# Patient Record
Sex: Male | Born: 1965 | Race: White | Hispanic: No | Marital: Single | Smoking: Current some day smoker
Health system: Southern US, Community
[De-identification: ages and names within clinical notes are randomized; demographics above are authoritative.]

## PROBLEM LIST (undated history)

## (undated) DIAGNOSIS — T148XXA Other injury of unspecified body region, initial encounter: Secondary | ICD-10-CM

## (undated) DIAGNOSIS — F209 Schizophrenia, unspecified: Secondary | ICD-10-CM

---

## 2017-04-15 ENCOUNTER — Emergency Department: Payer: Medicare Other

## 2017-04-15 ENCOUNTER — Other Ambulatory Visit: Payer: Self-pay

## 2017-04-15 ENCOUNTER — Emergency Department
Admission: EM | Admit: 2017-04-15 | Discharge: 2017-04-15 | Disposition: A | Discharge status: Home / Self Care | Payer: Medicare Other | Attending: Emergency Medicine | Admitting: Emergency Medicine

## 2017-04-15 DIAGNOSIS — Y939 Activity, unspecified: Secondary | ICD-10-CM | POA: Diagnosis not present

## 2017-04-15 DIAGNOSIS — S2241XA Multiple fractures of ribs, right side, initial encounter for closed fracture: Secondary | ICD-10-CM | POA: Insufficient documentation

## 2017-04-15 DIAGNOSIS — Z79899 Other long term (current) drug therapy: Secondary | ICD-10-CM | POA: Diagnosis not present

## 2017-04-15 DIAGNOSIS — F17210 Nicotine dependence, cigarettes, uncomplicated: Secondary | ICD-10-CM | POA: Insufficient documentation

## 2017-04-15 DIAGNOSIS — W010XXA Fall on same level from slipping, tripping and stumbling without subsequent striking against object, initial encounter: Secondary | ICD-10-CM | POA: Insufficient documentation

## 2017-04-15 DIAGNOSIS — Y999 Unspecified external cause status: Secondary | ICD-10-CM | POA: Insufficient documentation

## 2017-04-15 DIAGNOSIS — Y929 Unspecified place or not applicable: Secondary | ICD-10-CM | POA: Diagnosis not present

## 2017-04-15 DIAGNOSIS — S299XXA Unspecified injury of thorax, initial encounter: Secondary | ICD-10-CM | POA: Diagnosis present

## 2017-04-15 MED ORDER — NAPROXEN 500 MG PO TABS: 500.0000 mg | ORAL_TABLET | Freq: Two times a day (BID) | ORAL | 2 refills | Status: DC

## 2017-04-15 MED ORDER — KETOROLAC TROMETHAMINE 30 MG/ML IJ SOLN
30.0000 mg | Freq: Once | INTRAMUSCULAR | Status: AC
  Administered 2017-04-15: 30 mg via INTRAMUSCULAR

## 2017-04-15 MED ORDER — TRAMADOL HCL 50 MG PO TABS: 50.0000 mg | ORAL_TABLET | Freq: Four times a day (QID) | ORAL | 0 refills | Status: DC | PRN

## 2017-04-15 MED ORDER — KETOROLAC TROMETHAMINE 30 MG/ML IJ SOLN
INTRAMUSCULAR | Status: AC
  Filled 2017-04-15: qty 1

## 2017-04-15 NOTE — ED Notes (Signed)
Patient transported to X-ray 

## 2017-04-15 NOTE — ED Notes (Signed)
Patient taken to imaging. 

## 2017-04-15 NOTE — ED Triage Notes (Addendum)
Pt reports fell 2 days ago, now having some right sided rib pain. (reports ran into a trash can and fell to the ground) Pt reports bruising to area.

## 2017-04-15 NOTE — ED Provider Notes (Signed)
[facility] Emergency Department Provider Note   ____________________________________________    I have reviewed the triage vital signs and the nursing notes.   HISTORY  Chief Complaint rib pain     HPI Bobby Massey is a 52 y.o. male presents with complaints of right rib pain.  Patient reports 2 days ago he fell striking his right lower ribs on a concrete curb.  Since then he has had significant moderate to severe throbbing pain in his right chest.  It is worse if he tries to take a deep breath.  Denies shortness of breath.  No fevers or chills.  No cough.  No hemoptysis.  Denies assault   History reviewed. No pertinent past medical history.  There are no active problems to display for this patient.   History reviewed. No pertinent surgical history.  Prior to Admission medications   Medication Sig Start Date End Date Taking? Authorizing Provider  naproxen (NAPROSYN) 500 MG tablet Take 1 tablet (500 mg total) by mouth 2 (two) times daily with a meal. 04/15/17   Jene EveryKinner, Chester Romero, MD  traMADol (ULTRAM) 50 MG tablet Take 1 tablet (50 mg total) by mouth every 6 (six) hours as needed. 04/15/17 04/15/18  Jene EveryKinner, Demetrius Mahler, MD     Allergies Patient has no known allergies.  No family history on file.  Social History Social History   Tobacco Use  . Smoking status: Current Every Day Smoker  . Smokeless tobacco: Never Used  Substance Use Topics  . Alcohol use: No    Frequency: Never  . Drug use: No    Review of Systems  Constitutional: No fever/chills Eyes: No visual changes.  ENT: No neck pain Cardiovascular: As above Respiratory: As above Gastrointestinal: No abdominal pain.  No nausea, no vomiting.   Genitourinary: Negative for dysuria. Musculoskeletal: Negative for back pain. Skin: Negative for rash or bruising Neurological: Negative for headaches   ____________________________________________   PHYSICAL EXAM:  VITAL SIGNS: ED Triage  Vitals [04/15/17 1902]  Enc Vitals Group     BP (!) 148/79     Pulse Rate (!) 108     Resp 18     Temp 98.2 F (36.8 C)     Temp Source Oral     SpO2 99 %     Weight 84.8 kg (187 lb)     Height 1.829 m (6')     Head Circumference      Peak Flow      Pain Score      Pain Loc      Pain Edu?      Excl. in GC?     Constitutional: Alert and oriented. No acute distress. Eyes: Conjunctivae are normal.  Head: Atraumatic.    Neck:  Painless ROM Cardiovascular: Normal rate, regular rhythm. Grossly normal heart sounds.  Good peripheral circulation.  Right inferior chest wall significant tenderness palpation, no bony abnormalities, no bruising crepitus Respiratory: Normal respiratory effort.  No retractions. Lungs CTAB. Gastrointestinal: Soft and nontender. No distention.  No CVA tenderness. Genitourinary: deferred Musculoskeletal:   Warm and well perfused Neurologic:  Normal speech and language. No gross focal neurologic deficits are appreciated.  Skin:  Skin is warm, dry and intact. No rash noted. Psychiatric: Mood and affect are normal. Speech and behavior are normal.  ____________________________________________   LABS (all labs ordered are listed, but only abnormal results are displayed)  Labs Reviewed - No data to display ____________________________________________  EKG   ____________________________________________  RADIOLOGY  Rib x-rays ____________________________________________   PROCEDURES  Procedure(s) performed: No  Procedures   Critical Care performed: No ____________________________________________   INITIAL IMPRESSION / ASSESSMENT AND PLAN / ED COURSE  Pertinent labs & imaging results that were available during my care of the patient were reviewed by me and considered in my medical decision making (see chart for details).  Patient presents after trauma to the right lower ribs, dedicated rib films pending ,IM tramadol  X-rays demonstrate  acute anterior right eighth and ninth rib fractures, no pneumothorax  Patient had significant improvement with Toradol.  Will discharge with analgesics, I-S    ____________________________________________   FINAL CLINICAL IMPRESSION(S) / ED DIAGNOSES  Final diagnoses:  Closed fracture of multiple ribs of right side, initial encounter        Note:  This document was prepared using Dragon voice recognition software and may include unintentional dictation errors.    Jene Every, MD 04/15/17 2136

## 2017-04-15 NOTE — ED Triage Notes (Signed)
First Nurse Note:  Arrives via [facility] -- patient [place] up [place] police station.  C/O right rib pain.  EMS reports bruising to area.  Pateitn states he fell onto concrete two days ago.   AAOx3.  Skin warm and dry. No SOB/ DOE.  NAD.  Requests a dinner tray during ED stay.

## 2017-04-30 ENCOUNTER — Emergency Department
Admission: EM | Admit: 2017-04-30 | Discharge: 2017-04-30 | Disposition: A | Discharge status: Home / Self Care | Payer: Medicare Other | Attending: Emergency Medicine | Admitting: Emergency Medicine

## 2017-04-30 ENCOUNTER — Other Ambulatory Visit: Payer: Self-pay

## 2017-04-30 ENCOUNTER — Emergency Department: Payer: Medicare Other

## 2017-04-30 DIAGNOSIS — W19XXXD Unspecified fall, subsequent encounter: Secondary | ICD-10-CM | POA: Diagnosis not present

## 2017-04-30 DIAGNOSIS — S2241XD Multiple fractures of ribs, right side, subsequent encounter for fracture with routine healing: Secondary | ICD-10-CM | POA: Insufficient documentation

## 2017-04-30 DIAGNOSIS — F17200 Nicotine dependence, unspecified, uncomplicated: Secondary | ICD-10-CM | POA: Insufficient documentation

## 2017-04-30 DIAGNOSIS — S2241XA Multiple fractures of ribs, right side, initial encounter for closed fracture: Secondary | ICD-10-CM

## 2017-04-30 MED ORDER — KETOROLAC TROMETHAMINE 30 MG/ML IJ SOLN
30.0000 mg | Freq: Once | INTRAMUSCULAR | Status: AC
  Administered 2017-04-30: 30 mg via INTRAMUSCULAR
  Filled 2017-04-30: qty 1

## 2017-04-30 MED ORDER — IBUPROFEN 800 MG PO TABS: 800.0000 mg | ORAL_TABLET | Freq: Three times a day (TID) | ORAL | 0 refills | Status: DC | PRN

## 2017-04-30 NOTE — ED Notes (Signed)
See triage note states he fell about 1 week ago  Was dx;dwith rib fx  states pain is getting worse on the right  Denies any new injury  Has been taking tramadol w/o much relief

## 2017-04-30 NOTE — ED Provider Notes (Signed)
[facility] Emergency Department Provider Note  ____________________________________________  Time seen: Approximately 6:30 PM  I have reviewed the triage vital signs and the nursing notes.   HISTORY  Chief Complaint Back Pain and Pleurisy    HPI Bobby Massey is a 52 y.o. male that presents to the emergency department for evaluation of right lateral rib pain for 2 weeks.  Patient was seen in the emergency department after fall 2 weeks ago and was told that he has 2 rib fractures.  Pain is worsening.  He has not taken any medication for pain because his Medicare did not pay for the tramadol.  No new injury.  He has schizophrenia and would like to talk to psychiatry. He is not having any auditory or visual hallucination.  No suicidal or homicidal ideations. No shortness of breath, chest pain, nausea, vomiting, abdominal pain.  History reviewed. No pertinent past medical history.  There are no active problems to display for this patient.   History reviewed. No pertinent surgical history.  Prior to Admission medications   Medication Sig Start Date End Date Taking? Authorizing Provider  ibuprofen (ADVIL,MOTRIN) 800 MG tablet Take 1 tablet (800 mg total) by mouth every 8 (eight) hours as needed. 04/30/17   Enid Derry, PA-C  naproxen (NAPROSYN) 500 MG tablet Take 1 tablet (500 mg total) by mouth 2 (two) times daily with a meal. 04/15/17   Jene Every, MD  traMADol (ULTRAM) 50 MG tablet Take 1 tablet (50 mg total) by mouth every 6 (six) hours as needed. 04/15/17 04/15/18  Jene Every, MD    Allergies Patient has no known allergies.  No family history on file.  Social History Social History   Tobacco Use  . Smoking status: Current Every Day Smoker  . Smokeless tobacco: Never Used  Substance Use Topics  . Alcohol use: No    Frequency: Never  . Drug use: No     Review of Systems  Cardiovascular: No chest pain. Respiratory:  No  SOB. Gastrointestinal: No abdominal pain.  No nausea, no vomiting.  Musculoskeletal: Positive for rib pain. Skin: Negative for rash, abrasions, lacerations, ecchymosis. Neurological: Negative for headaches, numbness or tingling   ____________________________________________   PHYSICAL EXAM:  VITAL SIGNS: ED Triage Vitals [04/30/17 1722]  Enc Vitals Group     BP      Pulse      Resp      Temp      Temp src      SpO2      Weight 185 lb (83.9 kg)     Height 6' (1.829 m)     Head Circumference      Peak Flow      Pain Score 8     Pain Loc      Pain Edu?      Excl. in GC?      Constitutional: Alert and oriented. Well appearing and in no acute distress. Eyes: Conjunctivae are normal. PERRL. EOMI. Head: Atraumatic. ENT:      Ears:      Nose: No congestion/rhinnorhea.      Mouth/Throat: Mucous membranes are moist.  Neck: No stridor.  Cardiovascular: Normal rate, regular rhythm.  Good peripheral circulation. Respiratory: Normal respiratory effort without tachypnea or retractions. Lungs CTAB. Good air entry to the bases with no decreased or absent breath sounds.  Gastrointestinal: No abdominal tenderness to palpation. No guarding or rigidity. No distention. Musculoskeletal: Full range of motion to all extremities. No gross deformities appreciated.  Hypersensitive to palpation over right lateral inferior rib cage. Neurologic:  Normal speech and language. No gross focal neurologic deficits are appreciated.  Skin:  Skin is warm, dry and intact. No rash noted.   ____________________________________________   LABS (all labs ordered are listed, but only abnormal results are displayed)  Labs Reviewed - No data to display ____________________________________________  EKG   ____________________________________________  RADIOLOGY Lexine BatonI, Weylin Plagge, personally viewed and evaluated these images (plain radiographs) as part of my medical decision making, as well as reviewing the  written report by the radiologist.  Dg Ribs Unilateral W/chest Right  Result Date: 04/30/2017 CLINICAL DATA:  Right-sided rib pain. Recent fall with right eighth and ninth rib fractures. EXAM: RIGHT RIBS AND CHEST - 3+ VIEW COMPARISON:  04/15/2017 FINDINGS: Similar finding nondisplaced fractures involving the anterior aspects of the right eighth and ninth ribs that demonstrate no significant evidence of healing since the prior x-rays. Stable old, healed fractures of the right fourth through seventh ribs. Stable probable underlying chronic lung disease. There is no evidence of pulmonary edema, consolidation, pneumothorax, nodule or pleural fluid. IMPRESSION: Stable right distal eighth and ninth rib fractures demonstrating no displacement or significant evidence of interval healing. Electronically Signed   By: Irish LackGlenn  Yamagata M.D.   On: 04/30/2017 19:00    ____________________________________________    PROCEDURES  Procedure(s) performed:    Procedures    Medications  ketorolac (TORADOL) 30 MG/ML injection 30 mg (30 mg Intramuscular Given 04/30/17 1856)     ____________________________________________   INITIAL IMPRESSION / ASSESSMENT AND PLAN / ED COURSE  Pertinent labs & imaging results that were available during my care of the patient were reviewed by me and considered in my medical decision making (see chart for details).  Review of the [place] CSRS was performed in accordance of the [facility] prior to dispensing any controlled drugs.   Patient presents to the emergency department for evaluation of right lateral rib pain after fall 2 weeks ago.  Repeat chest x-ray consistent with previously noted rib fractures.  Then patient stated that he was really here to talk to psychiatry about being admitted because he is homeless and it is too cold outside. TTS evaluated patient. Patient does not have any new injury so there is no indication for narcotic pain medication.  Patient will be discharged  home with prescriptions for ibuprofen. Patient is to follow up with PCP as directed. Patient is given ED precautions to return to the ED for any worsening or new symptoms.     ____________________________________________  FINAL CLINICAL IMPRESSION(S) / ED DIAGNOSES  Final diagnoses:  Closed fracture of multiple ribs of right side, initial encounter      NEW MEDICATIONS STARTED DURING THIS VISIT:  ED Discharge Orders        Ordered    ibuprofen (ADVIL,MOTRIN) 800 MG tablet  Every 8 hours PRN     04/30/17 2106          This chart was dictated using voice recognition software/Dragon. Despite best efforts to proofread, errors can occur which can change the meaning. Any change was purely unintentional.    Enid DerryWagner, Shaine Mount, PA-C 04/30/17 [id number]    Dionne BucySiadecki, Sebastian, MD 04/30/17 2348

## 2017-04-30 NOTE — ED Notes (Signed)
TTS spoke with Mr. Bobby Massey. He denied symptoms of Depression or Anxiety.  He denied having auditory or visual hallucinations.  He denied suicidal or homicidal ideation or intent.  He states that he is homeless and that it is too cold for him to be outside.  He states that he did not want to go to the shelter because he "did not get along with the staff".  He reports that he has financial problems and has not received his check in some time.  He states that he has a history of paranoid schizophrenia, and has been off his medication for about 2 months.  He denied wanting to go to [facility] stating "I don't need them people".  He reports that he is now receiving services as [facility] on [address]  He reports that the has a Veterinary surgeoncounselor who has initiated services for him to see a psychiatrist and housing placement.  He states that he does not want the TTS to contact Social Services to identify areas that they could assist him with.  When asked how TTS could help him, He stated again, "It is too cold out there, I need to be in the hospital".  He was informed that TTS could not place him in the hospital this evening. Mr. Bobby Massey became loud and yelled at TTS that he investigated before he came to the hospital this evening and that he was going to be admitted because it is cold outside.

## 2017-04-30 NOTE — ED Triage Notes (Signed)
Pt comes into the ED via EMS from [facility] with c/o right lateral rib pain and back pain from a fall 2 weeks ago.Marland Kitchen. Pt is ambulatory to triage without any difficulty.

## 2017-05-16 ENCOUNTER — Emergency Department
Admission: EM | Admit: 2017-05-16 | Discharge: 2017-05-17 | Disposition: A | Discharge status: Home / Self Care | Payer: Medicare Other | Attending: Emergency Medicine | Admitting: Emergency Medicine

## 2017-05-16 ENCOUNTER — Other Ambulatory Visit: Payer: Self-pay

## 2017-05-16 ENCOUNTER — Encounter: Payer: Self-pay | Admitting: Emergency Medicine

## 2017-05-16 DIAGNOSIS — F200 Paranoid schizophrenia: Secondary | ICD-10-CM

## 2017-05-16 DIAGNOSIS — F17210 Nicotine dependence, cigarettes, uncomplicated: Secondary | ICD-10-CM | POA: Diagnosis not present

## 2017-05-16 DIAGNOSIS — F209 Schizophrenia, unspecified: Secondary | ICD-10-CM

## 2017-05-16 DIAGNOSIS — Z046 Encounter for general psychiatric examination, requested by authority: Secondary | ICD-10-CM | POA: Diagnosis present

## 2017-05-16 HISTORY — DX: Schizophrenia, unspecified: F20.9

## 2017-05-16 LAB — COMPREHENSIVE METABOLIC PANEL
ALT: 18 U/L (ref 17–63)
AST: 29 U/L (ref 15–41)
Albumin: 4.3 g/dL (ref 3.5–5.0)
Alkaline Phosphatase: 73 U/L (ref 38–126)
Anion gap: 11 (ref 5–15)
BUN: 11 mg/dL (ref 6–20)
CHLORIDE: 101 mmol/L (ref 101–111)
CO2: 23 mmol/L (ref 22–32)
CREATININE: 0.96 mg/dL (ref 0.61–1.24)
Calcium: 9.4 mg/dL (ref 8.9–10.3)
GFR calc Af Amer: >60 mL/min (ref >60)
GFR calc non Af Amer: >60 mL/min (ref >60)
Glucose, Bld: 162 mg/dL — ABNORMAL HIGH (ref 65–99)
Potassium: 3.7 mmol/L (ref 3.5–5.1)
SODIUM: 135 mmol/L (ref 135–145)
Total Bilirubin: 0.9 mg/dL (ref 0.3–1.2)
Total Protein: 7.5 g/dL (ref 6.5–8.1)

## 2017-05-16 LAB — CBC
HCT: 41.7 % (ref 40.0–52.0)
HEMOGLOBIN: 14.1 g/dL (ref 13.0–18.0)
MCH: 31 pg (ref 26.0–34.0)
MCHC: 33.9 g/dL (ref 32.0–36.0)
MCV: 91.4 fL (ref 80.0–100.0)
Platelets: 200 10*3/uL (ref 150–440)
RBC: 4.56 MIL/uL (ref 4.40–5.90)
RDW: 13.1 % (ref 11.5–14.5)
WBC: 8.3 10*3/uL (ref 3.8–10.6)

## 2017-05-16 LAB — ETHANOL: Alcohol, Ethyl (B): <10 mg/dL (ref <10)

## 2017-05-16 LAB — SALICYLATE LEVEL: Salicylate Lvl: <7 mg/dL (ref 2.8–30.0)

## 2017-05-16 LAB — ACETAMINOPHEN LEVEL: Acetaminophen (Tylenol), Serum: <10 ug/mL — ABNORMAL LOW (ref 10–30)

## 2017-05-16 MED ORDER — PALIPERIDONE PALMITATE 234 MG/1.5ML IM SUSP
234.0000 mg | Freq: Once | INTRAMUSCULAR | Status: AC
  Administered 2017-05-16: 234 mg via INTRAMUSCULAR
  Filled 2017-05-16: qty 1.5

## 2017-05-16 NOTE — BH Assessment (Signed)
This Clinical research associatewriter received a return call from patients sister, Burnell BlanksMargie Arnold and she stated she lives in [place] and is unable to pick up patient from ED.

## 2017-05-16 NOTE — ED Notes (Signed)
Pt. Requested and was given soft cover small new testament bible.

## 2017-05-16 NOTE — Discharge Instructions (Signed)
please return for any further problems. Follow-up with your doctor on Friday as planned.

## 2017-05-16 NOTE — ED Notes (Signed)
Dressed out by this Therapist, artN and keith EMP

## 2017-05-16 NOTE — Consult Note (Signed)
[place] Psychiatry Consult   Reason for Consult: Consult for 52 year old man who came voluntarily to the emergency room seeking help for psychiatric symptoms Referring Physician: Rip Harbour Patient Identification: Bobby Massey MRN:  [id number] Principal Diagnosis: Schizophrenia [facility]) Diagnosis:   Patient Active Problem List   Diagnosis Date Noted  . Schizophrenia ([place]) [F20.9] 05/16/2017    Total Time spent with patient: 1 hour  Subjective:   Bobby Massey is a 52 y.o. male patient admitted with "I need some help with my episode".  HPI: Patient interviewed chart reviewed.  Patient came to the emergency room today stating he was having a "schizophrenic episode".  He states that for the past couple months he has been having auditory and visual hallucinations.  When asked to describe them he is rather vague and cannot give much detail.  He says that he sees "people's faces" and that his hallucinations are "things that are not natural".  Patient says he has been sleeping poorly at night.  Has no clear place to stay right now.  Mood feels down and nervous.  He denies any suicidal or homicidal thoughts.  He denies that he has been drinking or using any drugs.  He says that he used to receive regular medication for schizophrenia but has not had any in a couple months.  He last remembers [place] being helpful for him.  Social history: Patient says he has been living in [facility] for several months.  He stayed at the shelter for a while but cannot go back for now.  He says he is living outside in the woods currently.  Medical history: Does not report any known medical problems  Substance abuse history: He says there are times when he drinks too much but has not been using any alcohol the last few months.  Denies any drug use.  Past Psychiatric History: Patient says he has had prior hospitalizations most recently in [place] at [facility].  He says he has been diagnosed with  schizophrenia.  Denies any history of suicide attempts or violence.  He remembers having been given an long-acting injection of Invega which he found very helpful.  Risk to Self: Suicidal Ideation: No Suicidal Intent: No Is patient at risk for suicide?: No Suicidal Plan?: No Access to Means: No What has been your use of drugs/alcohol within the last 12 months?: None reported  How many times?: 0 Other Self Harm Risks: None reported  Triggers for Past Attempts: None known Intentional Self Injurious Behavior: None Risk to Others: Homicidal Ideation: No Thoughts of Harm to Others: No Current Homicidal Intent: No Current Homicidal Plan: No Access to Homicidal Means: No Identified Victim: None reported  History of harm to others?: No Assessment of Violence: None Noted Violent Behavior Description: None reported  Does patient have access to weapons?: No Criminal Charges Pending?: No Does patient have a court date: No Prior Inpatient Therapy: Prior Inpatient Therapy: Yes Prior Therapy Facilty/Provider(s): "several" Reason for Treatment: psychosis Prior Outpatient Therapy: Prior Outpatient Therapy: No Prior Therapy Dates: None reported  Prior Therapy Facilty/Provider(s): None reported  Reason for Treatment: None reported  Does patient have an ACCT team?: No Does patient have Intensive In-House Services?  : No Does patient have [facility] services? : No Does patient have P4CC services?: No  Past Medical History:  Past Medical History:  Diagnosis Date  . Schizophrenia ([place])    History reviewed. No pertinent surgical history. Family History: History reviewed. No pertinent family history. Family Psychiatric  History:  Denies knowing of any Social History:  Social History   Substance and Sexual Activity  Alcohol Use No  . Frequency: Never     Social History   Substance and Sexual Activity  Drug Use No    Social History   Socioeconomic History  . Marital status: Single     Spouse name: None  . Number of children: None  . Years of education: None  . Highest education level: None  Social Needs  . Financial resource strain: None  . Food insecurity - worry: None  . Food insecurity - inability: None  . Transportation needs - medical: None  . Transportation needs - non-medical: None  Occupational History  . None  Tobacco Use  . Smoking status: Current Every Day Smoker  . Smokeless tobacco: Never Used  Substance and Sexual Activity  . Alcohol use: No    Frequency: Never  . Drug use: No  . Sexual activity: None  Other Topics Concern  . None  Social History Narrative  . None   Additional Social History:    Allergies:   Allergies  Allergen Reactions  . Haldol [Haloperidol]     "crazy"    Labs:  Results for orders placed or performed during the hospital encounter of 05/16/17 (from the past 48 hour(s))  Comprehensive metabolic panel     Status: Abnormal   Collection Time: 05/16/17 12:52 PM  Result Value Ref Range   Sodium 135 135 - 145 mmol/L   Potassium 3.7 3.5 - 5.1 mmol/L   Chloride 101 101 - 111 mmol/L   CO2 23 22 - 32 mmol/L   Glucose, Bld 162 (H) 65 - 99 mg/dL   BUN 11 6 - 20 mg/dL   Creatinine, Ser 0.96 0.61 - 1.24 mg/dL   Calcium 9.4 8.9 - 10.3 mg/dL   Total Protein 7.5 6.5 - 8.1 g/dL   Albumin 4.3 3.5 - 5.0 g/dL   AST 29 15 - 41 U/L   ALT 18 17 - 63 U/L   Alkaline Phosphatase 73 38 - 126 U/L   Total Bilirubin 0.9 0.3 - 1.2 mg/dL   GFR calc non Af Amer >60 >60 mL/min   GFR calc Af Amer >60 >60 mL/min    Comment: (NOTE) The eGFR has been calculated using the CKD EPI equation. This calculation has not been validated in all clinical situations. eGFR's persistently <60 mL/min signify possible Chronic Kidney Disease.    Anion gap 11 5 - 15    Comment: Performed at [facility], [address] [place], [place] [address]  Ethanol     Status: None   Collection Time: 05/16/17 12:52 PM  Result Value Ref Range   Alcohol,  Ethyl (B) <10 <10 mg/dL    Comment:        LOWEST DETECTABLE LIMIT FOR SERUM ALCOHOL IS 10 mg/dL FOR MEDICAL PURPOSES ONLY Performed at [facility], [address] [place], [place] [address]   Salicylate level     Status: None   Collection Time: 05/16/17 12:52 PM  Result Value Ref Range   Salicylate Lvl <8.2 2.8 - 30.0 mg/dL    Comment: Performed at [facility], [address] [place], [place] [address]  Acetaminophen level     Status: Abnormal   Collection Time: 05/16/17 12:52 PM  Result Value Ref Range   Acetaminophen (Tylenol), Serum <10 (L) 10 - 30 ug/mL    Comment:        THERAPEUTIC CONCENTRATIONS VARY SIGNIFICANTLY.  A RANGE OF 10-30 ug/mL MAY BE AN EFFECTIVE CONCENTRATION FOR MANY PATIENTS. HOWEVER, SOME ARE BEST TREATED AT CONCENTRATIONS OUTSIDE THIS RANGE. ACETAMINOPHEN CONCENTRATIONS >150 ug/mL AT 4 HOURS AFTER INGESTION AND >50 ug/mL AT 12 HOURS AFTER INGESTION ARE OFTEN ASSOCIATED WITH TOXIC REACTIONS. Performed at [facility], [address] [place], [place] [address]   cbc     Status: None   Collection Time: 05/16/17 12:52 PM  Result Value Ref Range   WBC 8.3 3.8 - 10.6 K/uL   RBC 4.56 4.40 - 5.90 MIL/uL   Hemoglobin 14.1 13.0 - 18.0 g/dL   HCT 41.7 40.0 - 52.0 %   MCV 91.4 80.0 - 100.0 fL   MCH 31.0 26.0 - 34.0 pg   MCHC 33.9 32.0 - 36.0 g/dL   RDW 13.1 11.5 - 14.5 %   Platelets 200 150 - 440 K/uL    Comment: Performed at [facility], [address] [place], [place] [address]    Current Facility-Administered Medications  Medication Dose Route Frequency Provider Last Rate Last Dose  . paliperidone (INVEGA SUSTENNA) injection 234 mg  234 mg Intramuscular Once Clapacs, Madie Reno, MD       Current Outpatient Medications  Medication Sig Dispense Refill  . ibuprofen (ADVIL,MOTRIN) 800 MG tablet Take 1 tablet (800 mg total) by mouth every 8 (eight) hours as needed. 30 tablet 0  . naproxen (NAPROSYN) 500 MG  tablet Take 1 tablet (500 mg total) by mouth 2 (two) times daily with a meal. 20 tablet 2  . traMADol (ULTRAM) 50 MG tablet Take 1 tablet (50 mg total) by mouth every 6 (six) hours as needed. 20 tablet 0    Musculoskeletal: Strength & Muscle Tone: within normal limits Gait & Station: normal Patient leans: Backward  Psychiatric Specialty Exam: Physical Exam  Nursing note and vitals reviewed. Constitutional: He appears well-developed and well-nourished.  HENT:  Head: Normocephalic and atraumatic.  Eyes: Conjunctivae are normal. Pupils are equal, round, and reactive to light.  Neck: Normal range of motion.  Cardiovascular: Regular rhythm and normal heart sounds.  Respiratory: Effort normal. No respiratory distress.  GI: Soft.  Musculoskeletal: Normal range of motion.  Neurological: He is alert.  Skin: Skin is warm and dry.  Psychiatric: Judgment normal. His affect is blunt. His speech is delayed. He is slowed. Thought content is not paranoid. Cognition and memory are normal. He expresses no homicidal and no suicidal ideation.    Review of Systems  Constitutional: Negative.   HENT: Negative.   Eyes: Negative.   Respiratory: Negative.   Cardiovascular: Negative.   Gastrointestinal: Negative.   Musculoskeletal: Negative.   Skin: Negative.   Neurological: Negative.   Psychiatric/Behavioral: Positive for depression and hallucinations. Negative for memory loss, substance abuse and suicidal ideas. The patient is nervous/anxious and has insomnia.     Blood pressure 118/66, pulse 96, temperature 97.7 F (36.5 C), temperature source Oral, resp. rate 20, height '5\' 11"'  (1.803 m), weight 83.9 kg (185 lb), SpO2 96 %.Body mass index is 25.8 kg/m.  General Appearance: Disheveled  Eye Contact:  Minimal  Speech:  Slow  Volume:  Decreased  Mood:  Depressed and Dysphoric  Affect:  Constricted  Thought Process:  Goal Directed  Orientation:  Full (Time, Place, and Person)  Thought Content:   Logical and Hallucinations: Auditory Visual  Suicidal Thoughts:  No  Homicidal Thoughts:  No  Memory:  Immediate;   Fair Recent;   Fair Remote;   Fair  Judgement:  Fair  Insight:  Fair  Psychomotor Activity:  Decreased  Concentration:  Concentration: Fair  Recall:  [employer] of Knowledge:  Fair  Language:  Fair  Akathisia:  No  Handed:  Right  AIMS (if indicated):     Assets:  Desire for Improvement Physical Health  ADL's:  Intact  Cognition:  WNL  Sleep:        Treatment Plan Summary: Medication management and Plan 52 year old man who claims to have a past history of schizophrenia.  We have no old records available that indicate psychiatric history.  He is very disheveled and appears to have been living outdoors.  He is lucid however and despite his complaints of auditory hallucinations he is able to engage in appropriate conversation.  Denies suicidal or homicidal ideation.  Patient does not meet commitment criteria.  We currently do not have bed space available in the hospital.  I offered him the opportunity to get a long-acting Invega injection today as he says he has a follow-up appointment to see a doctor on Friday.  Patient is agreeable to the plan.  Case reviewed with emergency room physician.  After getting his injection he can be discharged as he has local follow-up in place.  Disposition: No evidence of imminent risk to self or others at present.   Supportive therapy provided about ongoing stressors. Discussed crisis plan, support from social network, calling 911, coming to the Emergency Department, and calling Suicide Hotline.  Alethia Berthold, MD 05/16/2017 4:48 PM

## 2017-05-16 NOTE — BH Assessment (Signed)
Assessment Note  Bobby Massey is an 52 y.o. male. Patient presents to [facility] under IVC due to psychosis. Patient denied SI/HI, however endorsed AVH. Patient stated he is seeing "vulgar things" he'd rather not disclose. Patient endorsed hearing voices trying steal things from him. Patient endorsed a Paranoid Schizophrenia diagnosis and stated he is having an episode. Patient denied SI/HI, and any outpatient mental health providers. In addition, patient endorsed several psychiatric inpatient hospitalizations.   Diagnosis: Psychosis  Past Medical History:  Past Medical History:  Diagnosis Date  . Schizophrenia ([facility])     History reviewed. No pertinent surgical history.  Family History: History reviewed. No pertinent family history.  Social History:  reports that he has been smoking.  he has never used smokeless tobacco. He reports that he does not drink alcohol or use drugs.  Additional Social History:  Alcohol / Drug Use Pain Medications: SEE PTA  Prescriptions: SEE PTA  Over the Counter: SEE PTA History of alcohol / drug use?: No history of alcohol / drug abuse Longest period of sobriety (when/how long): Unknown  CIWA: CIWA-Ar BP: 118/66 Pulse Rate: 96 COWS:    Allergies:  Allergies  Allergen Reactions  . Haldol [Haloperidol]     "crazy"    Home Medications:  (Not in a hospital admission)  OB/GYN Status:  No LMP for male patient.  General Assessment Data Assessment unable to be completed: (Assessment completed ) Reason for not completing assessment: Assessment completed  [facility] Assessment: In system Is this a Tele or Face-to-Face Assessment?: Face-to-Face Is this an Initial Assessment or a Re-assessment for this encounter?: Initial Assessment Marital status: Single Maiden name: N/A Is patient pregnant?: No Pregnancy Status: No Living Arrangements: Other (Comment)(Homeless) Can pt return to current living arrangement?: Yes Admission  Status: Voluntary Is patient capable of signing voluntary admission?: Yes Referral Source: Self/Family/Friend Insurance type: Medicare  Medical Screening Exam [facility] Walk-in ONLY) Medical Exam completed: Yes  Crisis Care Plan Living Arrangements: Other (Comment)(Homeless) Legal Guardian: Other:(None reported ) Name of Psychiatrist: None reported  Name of Therapist: None reported   Education Status Is patient currently in school?: No Current Grade: N/A Highest grade of school patient has completed: 8th grade  Name of school: N/A Contact person: N/A  Risk to self with the past 6 months Suicidal Ideation: No Has patient been a risk to self within the past 6 months prior to admission? : No Suicidal Intent: No Has patient had any suicidal intent within the past 6 months prior to admission? : No Is patient at risk for suicide?: No Suicidal Plan?: No Has patient had any suicidal plan within the past 6 months prior to admission? : No Access to Means: No What has been your use of drugs/alcohol within the last 12 months?: None reported  Previous Attempts/Gestures: No How many times?: 0 Other Self Harm Risks: None reported  Triggers for Past Attempts: None known Intentional Self Injurious Behavior: None Family Suicide History: No Recent stressful life event(s): Other (Comment)(Homelessness) Persecutory voices/beliefs?: No Depression: Yes Depression Symptoms: Loss of interest in usual pleasures Substance abuse history and/or treatment for substance abuse?: No Suicide prevention information given to non-admitted patients: Not applicable  Risk to Others within the past 6 months Homicidal Ideation: No Does patient have any lifetime risk of violence toward others beyond the six months prior to admission? : No Thoughts of Harm to Others: No Current Homicidal Intent: No Current Homicidal Plan: No Access to Homicidal Means: No Identified Victim:  None reported  History of harm to  others?: No Assessment of Violence: None Noted Violent Behavior Description: None reported  Does patient have access to weapons?: No Criminal Charges Pending?: No Does patient have a court date: No Is patient on probation?: No  Psychosis Hallucinations: None noted Delusions: None noted  Mental Status Report Appearance/Hygiene: In hospital gown, Disheveled Eye Contact: Poor Motor Activity: Unremarkable Speech: Pressured Level of Consciousness: Alert Mood: Preoccupied Affect: Preoccupied Anxiety Level: None Thought Processes: Thought Blocking Judgement: Impaired Orientation: Person, Place, Time, Situation, Appropriate for developmental age Obsessive Compulsive Thoughts/Behaviors: None  Cognitive Functioning Concentration: Fair Memory: Remote Intact, Recent Intact IQ: Average Insight: Poor Impulse Control: Poor Appetite: Poor Weight Loss: 0 Weight Gain: 0 Sleep: Decreased Total Hours of Sleep: 2 Vegetative Symptoms: None  ADLScreening [facility] Assessment Services) Patient's cognitive ability adequate to safely complete daily activities?: Yes Patient able to express need for assistance with ADLs?: Yes Independently performs ADLs?: Yes (appropriate for developmental age)  Prior Inpatient Therapy Prior Inpatient Therapy: Yes Prior Therapy Facilty/Provider(s): "several" Reason for Treatment: psychosis  Prior Outpatient Therapy Prior Outpatient Therapy: No Prior Therapy Dates: None reported  Prior Therapy Facilty/Provider(s): None reported  Reason for Treatment: None reported  Does patient have an ACCT team?: No Does patient have Intensive In-House Services?  : No Does patient have [facility] services? : No Does patient have P4CC services?: No  ADL Screening (condition at time of admission) Patient's cognitive ability adequate to safely complete daily activities?: Yes Is the patient deaf or have difficulty hearing?: No Does the patient have difficulty seeing, even when  wearing glasses/contacts?: No Does the patient have difficulty concentrating, remembering, or making decisions?: No Patient able to express need for assistance with ADLs?: Yes Does the patient have difficulty dressing or bathing?: No Independently performs ADLs?: Yes (appropriate for developmental age) Does the patient have difficulty walking or climbing stairs?: No Weakness of Legs: None  Home Assistive Devices/Equipment Home Assistive Devices/Equipment: None  Therapy Consults (therapy consults require a physician order) PT Evaluation Needed: No OT Evalulation Needed: No SLP Evaluation Needed: No Abuse/Neglect Assessment (Assessment to be complete while patient is alone) Abuse/Neglect Assessment Can Be Completed: Yes Physical Abuse: Denies Verbal Abuse: Denies Sexual Abuse: Denies Exploitation of patient/patient's resources: Denies Self-Neglect: Denies Possible abuse reported to:: Other (Comment) Values / Beliefs Cultural Requests During Hospitalization: None Spiritual Requests During Hospitalization: None Consults Spiritual Care Consult Needed: No Social Work Consult Needed: No Merchant navy officer (For Healthcare) Does Patient Have a Medical Advance Directive?: No    Additional Information 1:1 In Past 12 Months?: No CIRT Risk: No Elopement Risk: No Does patient have medical clearance?: Yes     Disposition:  Disposition Initial Assessment Completed for this Encounter: Yes Disposition of Patient: Pending Review with psychiatrist  On Site Evaluation by:   Reviewed with Physician:    Tramaine Sauls L Britainy Kozub, LPCA, LCASA 05/16/2017 2:50 PM

## 2017-05-16 NOTE — ED Provider Notes (Signed)
patient seen by Dr. Toni Amendlapacs. Dr. Toni Amendlapacs feels the patient can get the long-acting injection that he asked for and follow-up with his doctor on Friday. He is not acutely suicidal or homicidal and appears to be able to manage himself adequately as an outpatient.   Arnaldo NatalMalinda, Lamona Eimer F, MD 05/16/17 [phone]

## 2017-05-16 NOTE — ED Notes (Signed)
Pt. Requested and was given meal tray and drink.  Pt. Made familiar with unit.  Pt. Aware he would be spending the night in unit tonight.  Pt. Has no complaints at this time.

## 2017-05-16 NOTE — ED Notes (Signed)
Pt. Moved from [facility] to [facility].  Pt. Will be picked up around 9 am tomorrow.  Pt. Has discharge paperwork in chart.

## 2017-05-16 NOTE — ED Notes (Signed)
Charlynn GrimesMichelle murdock is therapist and wanted to leave number if pt needs anything [phone]

## 2017-05-16 NOTE — BH Assessment (Signed)
This Clinical research associatewriter attempted to call patient's therapist, Charlynn GrimesMichelle Murdock at [phone] and left HIPPA compliant message for turn call. This writer attempted to call patient's sister, Burnell BlanksMargie Arnold @ [phone] and left HIPPA compliant for turn call.

## 2017-05-16 NOTE — ED Triage Notes (Signed)
"  I am having an episode of paranoid schizophrenia".  He reports has been going on for some time and getting worse over the weak.  Admits to hearing things that are not there.  Denies SI/HI at this time. Here with therapist.

## 2017-05-16 NOTE — ED Notes (Signed)
VOL/ Will be picked up @ 9 am

## 2017-05-16 NOTE — BH Assessment (Signed)
This Clinical research associatewriter spoke with Bobby GrimesMichelle Massey at [phone] and she stated she or a staff member from [facility] will pick patient up from [facility] tomorrow @ 9am. This writer communicated this to ED Dr. Darnelle CatalanMalinda and Isla PenceQuad RN, [place].

## 2017-05-16 NOTE — ED Provider Notes (Signed)
[facility] Emergency Department Provider Note       Time seen: ----------------------------------------- 1:07 PM on 05/16/2017 -----------------------------------------   I have reviewed the triage vital signs and the nursing notes.  HISTORY   Chief Complaint Psychiatric Evaluation    HPI Carlton AdamDouglas Eland is a 52 y.o. male with a history of schizophrenia who presents to the ED for feeling like he is having an episode of apparent schizophrenia.  Patient reports this is been going on for some time and getting worse over the past week.  He admits to hearing things that are not there.  He denies suicidal or homicidal ideation peer therapist.  He denies any other illness.  Past Medical History:  Diagnosis Date  . Schizophrenia (HCC)     There are no active problems to display for this patient.   History reviewed. No pertinent surgical history.  Allergies Haldol [haloperidol]  Social History Social History   Tobacco Use  . Smoking status: Current Every Day Smoker  . Smokeless tobacco: Never Used  Substance Use Topics  . Alcohol use: No    Frequency: Never  . Drug use: No    Review of Systems Constitutional: Negative for fever. Cardiovascular: Negative for chest pain. Respiratory: Negative for shortness of breath. Gastrointestinal: Negative for abdominal pain, vomiting and diarrhea. Musculoskeletal: Negative for back pain. Skin: Negative for rash. Neurological: Negative for headaches, focal weakness or numbness. Psychiatric: Positive for hallucinations  All systems negative/normal/unremarkable except as stated in the HPI  ____________________________________________   PHYSICAL EXAM:  VITAL SIGNS: ED Triage Vitals  Enc Vitals Group     BP 05/16/17 1253 118/66     Pulse Rate 05/16/17 1253 96     Resp 05/16/17 1253 20     Temp 05/16/17 1253 97.7 F (36.5 C)     Temp Source 05/16/17 1253 Oral     SpO2 05/16/17 1253 96 %     Weight  05/16/17 1250 185 lb (83.9 kg)     Height 05/16/17 1250 5\' 11"  (1.803 m)     Head Circumference --      Peak Flow --      Pain Score 05/16/17 1250 8     Pain Loc --      Pain Edu? --      Excl. in GC? --     Constitutional: Alert and oriented. Well appearing and in no distress. Eyes: Conjunctivae are normal. Normal extraocular movements. ENT   Head: Normocephalic and atraumatic.   Nose: No congestion/rhinnorhea.   Mouth/Throat: Mucous membranes are moist.   Neck: No stridor. Cardiovascular: Normal rate, regular rhythm. No murmurs, rubs, or gallops. Respiratory: Normal respiratory effort without tachypnea nor retractions. Breath sounds are clear and equal bilaterally. No wheezes/rales/rhonchi. Gastrointestinal: Soft and nontender. Normal bowel sounds Musculoskeletal: Nontender with normal range of motion in extremities. No lower extremity tenderness nor edema. Neurologic:  Normal speech and language. No gross focal neurologic deficits are appreciated.  Skin:  Skin is warm, dry and intact. No rash noted. Psychiatric: Mood and affect are normal. Speech and behavior are normal.  ____________________________________________  ED COURSE:  As part of my medical decision making, I reviewed the following data within the electronic MEDICAL RECORD NUMBER History obtained from family if available, nursing notes, old chart and ekg, as well as notes from prior ED visits. Patient presented for hallucinations with a history of schizophrenia, we will assess with labs and consult psychiatry.   Procedures ____________________________________________   LABS (pertinent positives/negatives)  Labs Reviewed  COMPREHENSIVE METABOLIC PANEL  ETHANOL  SALICYLATE LEVEL  ACETAMINOPHEN LEVEL  CBC  URINE DRUG SCREEN, QUALITATIVE ([facility] ONLY)  ____________________________________________  DIFFERENTIAL DIAGNOSIS   Schizophrenia, medication noncompliance, occult infection, drug abuse, alcohol  abuse  FINAL ASSESSMENT AND PLAN  Schizophrenia   Plan: Patient had presented for schizophrenia related complaints and hallucinations. Patient's labs are unremarkable, he is medically stable for psychiatric evaluation.   Ulice Dash, MD   Note: This note was generated in part or whole with voice recognition software. Voice recognition is usually quite accurate but there are transcription errors that can and very often do occur. I apologize for any typographical errors that were not detected and corrected.     Emily Filbert, MD 05/16/17 1309

## 2017-05-17 NOTE — ED Notes (Signed)
Pt. Requested to talk in dayroom, pt. Requested information on shelter in area.  Pt. Given printed copy of [facility] hours and services.  Pt. Grateful with information.

## 2017-07-09 ENCOUNTER — Emergency Department
Admission: EM | Admit: 2017-07-09 | Discharge: 2017-07-09 | Discharge status: Against Medical Advice | Payer: Medicare Other | Attending: Emergency Medicine | Admitting: Emergency Medicine

## 2017-07-09 ENCOUNTER — Encounter: Payer: Self-pay | Admitting: Emergency Medicine

## 2017-07-09 DIAGNOSIS — Z79899 Other long term (current) drug therapy: Secondary | ICD-10-CM | POA: Insufficient documentation

## 2017-07-09 DIAGNOSIS — F209 Schizophrenia, unspecified: Secondary | ICD-10-CM | POA: Diagnosis present

## 2017-07-09 DIAGNOSIS — F17210 Nicotine dependence, cigarettes, uncomplicated: Secondary | ICD-10-CM | POA: Insufficient documentation

## 2017-07-09 MED ORDER — BUSPIRONE HCL 5 MG PO TABS
10.0000 mg | ORAL_TABLET | Freq: Once | ORAL | Status: DC
  Filled 2017-07-09: qty 2

## 2017-07-09 MED ORDER — VORTIOXETINE HBR 5 MG PO TABS
5.0000 mg | ORAL_TABLET | Freq: Every day | ORAL | Status: DC
  Filled 2017-07-09 (×2): qty 1

## 2017-07-09 MED ORDER — VORTIOXETINE HBR 5 MG PO TABS: 5.0000 mg | ORAL_TABLET | Freq: Every day | ORAL | Status: DC

## 2017-07-09 NOTE — ED Notes (Signed)
Patient presents to the ED stating, I need some medication because I had an "episode" last night.  Patient reports feeling anxious and pacing.  Patient states he has an appointment with his therapist today but it is a counselor who cannot prescribe medications.

## 2017-07-09 NOTE — ED Notes (Signed)
Per security staff, they witnessed patient leaving room 24 and asking staff which way was the way out of the room.  This RN looked for patient to speak with him and to attempt to give him his medications and discharge papers but was unable to find patient.

## 2017-07-09 NOTE — ED Triage Notes (Addendum)
Pt brought to ED by police officer; officer says pt is homeless and has been wandering around [place] for the last few days; is out of his medications for several weeks and is "suffering from some mental health issues"; pt here voluntarily; pt says he's depressed; denies suicidal or homicidal thoughts; pt says he has an appt with his psychiatrist at 10am today and just wants a dose of his medications

## 2017-07-09 NOTE — ED Notes (Signed)
Patient states he has an appointment at 10 he needs to get to.  This RN encouraged patient to stay and explained we are waiting on medication from the pharmacy.  Patient verbalized understanding.

## 2017-07-09 NOTE — ED Provider Notes (Signed)
[facility] Emergency Department Provider Note   ____________________________________________    I have reviewed the triage vital signs and the nursing notes.   HISTORY  Chief Complaint Mental Health Problem     HPI Bobby Massey is a 52 y.o. male with a history of schizophrenia brought in today for evaluation.  Patient reports that he has been out of his BuSpar and trintillex for several weeks and notes that earlier today he was having auditory hallucinations so he asked a police officer to bring him to the emergency department.  He does report that he feels much better currently and notes that he has an appointment with his psychiatrist at 10 AM that he does not want to miss this appointment.  He is not homicidal or suicidal.  No hallucinations at this time.  Review of records demonstrates that the patient is successful at managing his symptoms as an outpatient.   Past Medical History:  Diagnosis Date  . Schizophrenia [facility])     Patient Active Problem List   Diagnosis Date Noted  . Schizophrenia (HCC) 05/16/2017    History reviewed. No pertinent surgical history.  Prior to Admission medications   Medication Sig Start Date End Date Taking? Authorizing Provider  busPIRone (BUSPAR) 10 MG tablet Take 10 mg by mouth 3 (three) times daily.   Yes [provider]     Allergies Haldol [haloperidol]  History reviewed. No pertinent family history.  Social History Social History   Tobacco Use  . Smoking status: Current Every Day Smoker    Packs/day: 0.50    Types: Cigarettes  . Smokeless tobacco: Never Used  Substance Use Topics  . Alcohol use: No    Frequency: Never  . Drug use: No    Review of Systems  Constitutional: No known fever Eyes: No visual changes.  ENT: No sore throat. Cardiovascular: Denies chest pain. Respiratory: Denies shortness of breath. Gastrointestinal: No abdominal pain.   Genitourinary: Negative for  dysuria. Musculoskeletal: Negative for back pain. Skin: Negative for rash. Neurological: Negative for headaches   ____________________________________________   PHYSICAL EXAM:  VITAL SIGNS: ED Triage Vitals  Enc Vitals Group     BP 07/09/17 0703 (!) 152/89     Pulse Rate 07/09/17 0703 80     Resp 07/09/17 0703 19     Temp 07/09/17 0703 97.7 F (36.5 C)     Temp Source 07/09/17 0703 Oral     SpO2 07/09/17 0703 100 %     Weight 07/09/17 0701 84.8 kg (187 lb)     Height 07/09/17 0701 1.803 m (5\' 11" )     Head Circumference --      Peak Flow --      Pain Score 07/09/17 0700 0     Pain Loc --      Pain Edu? --      Excl. in GC? --     Constitutional: Alert and oriented. No acute distress. Pleasant and interactive Eyes: Conjunctivae are normal.   Nose: No congestion/rhinnorhea. Mouth/Throat: Mucous membranes are moist.    Cardiovascular: Normal rate, regular rhythm. Grossly normal heart sounds.  Good peripheral circulation. Respiratory: Normal respiratory effort.  No retractions. Lungs CTAB. Gastrointestinal: Soft and nontender. No distention.  No CVA tenderness. Genitourinary: deferred Musculoskeletal: No lower extremity tenderness nor edema.  Warm and well perfused Neurologic:  Normal speech and language. No gross focal neurologic deficits are appreciated.  Skin:  Skin is warm, dry and intact. No rash noted. Psychiatric:  Mood and affect are normal. Speech and behavior are normal.  ____________________________________________   LABS (all labs ordered are listed, but only abnormal results are displayed)  Labs Reviewed - No data to display ____________________________________________  EKG  None ____________________________________________  RADIOLOGY  None ____________________________________________   PROCEDURES  Procedure(s) performed: No  Procedures   Critical Care performed: No ____________________________________________   INITIAL IMPRESSION /  ASSESSMENT AND PLAN / ED COURSE  Pertinent labs & imaging results that were available during my care of the patient were reviewed by me and considered in my medical decision making (see chart for details).  Patient with history of schizophrenia comes in today for evaluation, he appears to be at his baseline.  He has requested a dose of his medications and we have provided them.  Not homicidal, not suicidal, not psychotic.  Quite calm and appropriate.  Has appointment with Dr. Felicity PellegriniMurdock at 10 AM today, he does not want to miss this appointment and I do not think that he needs to stay in the emergency department for further evaluation given outpatient appointment already arranged  ----------------------------------------- 9:20 AM on 07/09/2017 -----------------------------------------  Delay in pharmacy sending medications, patient left because he had to make his appointment before receiving meds.    ____________________________________________   FINAL CLINICAL IMPRESSION(S) / ED DIAGNOSES  Final diagnoses:  Schizophrenia, unspecified type (HCC)        Note:  This document was prepared using Dragon voice recognition software and may include unintentional dictation errors.    Jene EveryKinner, Rogelio Winbush, MD 07/09/17 [phone]

## 2017-07-19 ENCOUNTER — Other Ambulatory Visit: Payer: Self-pay

## 2017-07-19 ENCOUNTER — Encounter: Payer: Self-pay | Admitting: Emergency Medicine

## 2017-07-19 ENCOUNTER — Emergency Department
Admission: EM | Admit: 2017-07-19 | Discharge: 2017-07-19 | Disposition: A | Discharge status: Home / Self Care | Payer: Medicare Other | Attending: Emergency Medicine | Admitting: Emergency Medicine

## 2017-07-19 DIAGNOSIS — F10129 Alcohol abuse with intoxication, unspecified: Secondary | ICD-10-CM | POA: Diagnosis present

## 2017-07-19 DIAGNOSIS — F10929 Alcohol use, unspecified with intoxication, unspecified: Secondary | ICD-10-CM

## 2017-07-19 DIAGNOSIS — F17210 Nicotine dependence, cigarettes, uncomplicated: Secondary | ICD-10-CM | POA: Diagnosis not present

## 2017-07-19 NOTE — ED Notes (Signed)
Pt. States has slurred speech and difficult to understand.

## 2017-07-19 NOTE — ED Notes (Signed)
Pt sleeping. Breakfast tray placed at head of bed.

## 2017-07-19 NOTE — ED Triage Notes (Signed)
Pt to triage via w/c with no distress noted, slurred speech, [place] [facility]; pt reports wanting detox for alcohol; denis SI or HI; pt with rambling speech

## 2017-07-19 NOTE — ED Notes (Signed)
Patient denies SI,HI and AVH.Stated that he is ready to go home.Patient verbalized discharge instructions.Left unit ambulatory.

## 2017-07-19 NOTE — ED Provider Notes (Signed)
Patient is ambulatory, speaking in normal sentences, and requesting discharge at this time.  Follow-up instructions as well as return precautions were discussed.   Rockne MenghiniNorman, Anne-Caroline, MD 07/19/17 [phone]

## 2017-07-19 NOTE — ED Notes (Signed)
Pt. Yelling out, random slurs.  Pt. Redirected with warm blanket and therapeutic communication.

## 2017-07-19 NOTE — ED Provider Notes (Signed)
[facility] Emergency Department Provider Note    First MD Initiated Contact with Patient 07/19/17 0401     (approximate)  I have reviewed the triage vital signs and the nursing notes.   HISTORY  Chief Complaint Alcohol Intoxication    HPI Bobby Massey is a 52 y.o. male presents emergency department [place] police secondary to concern for alcohol intoxication.  It was reported that the patient requested detox however during my evaluation patient did not request detox.  Patient denies any suicidal or homicidal ideation.  Past Medical History:  Diagnosis Date  . Schizophrenia [facility](HCC)     Patient Active Problem List   Diagnosis Date Noted  . Schizophrenia (HCC) 05/16/2017    History reviewed. No pertinent surgical history.  Prior to Admission medications   Medication Sig Start Date End Date Taking? Authorizing Provider  busPIRone (BUSPAR) 10 MG tablet Take 10 mg by mouth 3 (three) times daily.    [provider]    Allergies Haldol [haloperidol]  No family history on file.  Social History Social History   Tobacco Use  . Smoking status: Current Every Day Smoker    Packs/day: 0.50    Types: Cigarettes  . Smokeless tobacco: Never Used  Substance Use Topics  . Alcohol use: No    Frequency: Never  . Drug use: No    Review of Systems Constitutional: No fever/chills Eyes: No visual changes. ENT: No sore throat. Cardiovascular: Denies chest pain. Respiratory: Denies shortness of breath. Gastrointestinal: No abdominal pain.  No nausea, no vomiting.  No diarrhea.  No constipation. Genitourinary: Negative for dysuria. Musculoskeletal: Negative for neck pain.  Negative for back pain. Integumentary: Negative for rash. Neurological: Negative for headaches, focal weakness or numbness. Psychiatric:Positive for alcohol intoxication   ____________________________________________   PHYSICAL EXAM:  VITAL SIGNS: ED Triage  Vitals  Enc Vitals Group     BP 07/19/17 0048 (!) 156/113     Pulse Rate 07/19/17 0048 97     Resp 07/19/17 0048 18     Temp 07/19/17 0048 97.9 F (36.6 C)     Temp Source 07/19/17 0048 Oral     SpO2 07/19/17 0048 99 %     Weight 07/19/17 0047 84.8 kg (187 lb)     Height 07/19/17 0047 1.803 m (5\' 11" )     Head Circumference --      Peak Flow --      Pain Score 07/19/17 0047 0     Pain Loc --      Pain Edu? --      Excl. in GC? --     Constitutional: Alert and oriented.  Appears intoxicated eyes: Conjunctivae are normal.  Head: Atraumatic. Mouth/Throat: Mucous membranes are moist.  Oropharynx non-erythematous. Neck: No stridor.   Cardiovascular: Normal rate, regular rhythm. Good peripheral circulation. Grossly normal heart sounds. Respiratory: Normal respiratory effort.  No retractions. Lungs CTAB. Gastrointestinal: Soft and nontender. No distention.  Musculoskeletal: No lower extremity tenderness nor edema. No gross deformities of extremities. Neurologic:  Normal speech and language. No gross focal neurologic deficits are appreciated.  Skin:  Skin is warm, dry and intact. No rash noted. Psychiatric: Appears intoxicated rambling nonsensical speech  Procedures   ____________________________________________   INITIAL IMPRESSION / ASSESSMENT AND PLAN / ED COURSE  As part of my medical decision making, I reviewed the following data within the electronic MEDICAL RECORD NUMBER   52 year old male [place] [facility] [place] for alcohol  intoxication.  Patient refused alcohol detox ____________________________  FINAL CLINICAL IMPRESSION(S) / ED DIAGNOSES  Final diagnoses:  Alcoholic intoxication with complication (HCC)     MEDICATIONS GIVEN DURING THIS VISIT:  Medications - No data to display   ED Discharge Orders    None       Note:  This document was prepared using Dragon voice recognition software and may include  unintentional dictation errors.    Darci Current, MD 07/19/17 0500

## 2017-07-25 ENCOUNTER — Encounter: Payer: Self-pay | Admitting: Emergency Medicine

## 2017-07-25 ENCOUNTER — Other Ambulatory Visit: Payer: Self-pay

## 2017-07-25 ENCOUNTER — Emergency Department
Admission: EM | Admit: 2017-07-25 | Discharge: 2017-07-25 | Payer: Medicare Other | Attending: Emergency Medicine | Admitting: Emergency Medicine

## 2017-07-25 DIAGNOSIS — F10929 Alcohol use, unspecified with intoxication, unspecified: Secondary | ICD-10-CM | POA: Diagnosis present

## 2017-07-25 DIAGNOSIS — F17210 Nicotine dependence, cigarettes, uncomplicated: Secondary | ICD-10-CM | POA: Diagnosis not present

## 2017-07-25 DIAGNOSIS — Z79899 Other long term (current) drug therapy: Secondary | ICD-10-CM | POA: Diagnosis not present

## 2017-07-25 NOTE — ED Notes (Signed)
Patient was discharged into police custody. Patient was medically cleared by EDP for jail. Patient placed into handcuff and walked out with police.

## 2017-07-25 NOTE — ED Triage Notes (Signed)
Patient ambulatory to triage with steady gait, without difficulty or distress noted; pt in custody of [place] [employer], here for medical clearance; officer st "has had a lot to drink"

## 2017-07-25 NOTE — ED Provider Notes (Signed)
[facility] Emergency Department Provider Note  ____________________________________________   First MD Initiated Contact with Patient 07/25/17 [phone]     (approximate)  I have reviewed the triage vital signs and the nursing notes.   HISTORY  Chief Complaint Medical Clearance  History is limited by the patient's alcohol intoxication  HPI Bobby Massey is a 52 y.o. male who was brought to the emergency department by [facility] police for medical clearance prior to booking into jail.  Apparently the patient was intoxicated with alcohol tonight and when they brought him to jail for booking he was "too intoxicated" so they brought him to the emergency department to further evaluate.  The patient himself does confess to drinking alcohol but he says that had he not been arrested he would not have come to the emergency department.  He has no complaints at this time.  Past Medical History:  Diagnosis Date  . Schizophrenia [facility])     Patient Active Problem List   Diagnosis Date Noted  . Schizophrenia (HCC) 05/16/2017    History reviewed. No pertinent surgical history.  Prior to Admission medications   Medication Sig Start Date End Date Taking? Authorizing Provider  busPIRone (BUSPAR) 10 MG tablet Take 10 mg by mouth 3 (three) times daily.    [provider]    Allergies Haldol [haloperidol]  No family history on file.  Social History Social History   Tobacco Use  . Smoking status: Current Every Day Smoker    Packs/day: 0.50    Types: Cigarettes  . Smokeless tobacco: Never Used  Substance Use Topics  . Alcohol use: Yes    Frequency: Never  . Drug use: No    Review of Systems History limited by the patient's clinical condition  ____________________________________________   PHYSICAL EXAM:  VITAL SIGNS: ED Triage Vitals  Enc Vitals Group     BP 07/25/17 0437 110/70     Pulse Rate 07/25/17 0437 93     Resp 07/25/17 0437 18   Temp 07/25/17 0437 97.7 F (36.5 C)     Temp Source 07/25/17 0437 Oral     SpO2 07/25/17 0437 98 %     Weight 07/25/17 0436 187 lb (84.8 kg)     Height 07/25/17 0436 5\' 11"  (1.803 m)     Head Circumference --      Peak Flow --      Pain Score 07/25/17 0436 0     Pain Loc --      Pain Edu? --      Excl. in GC? --     Constitutional: Slurring his speech and clearly intoxicated with heavy alcohol on his breath Head: Atraumatic.  Pupils are mid range and brisk Nose: No congestion/rhinnorhea. Mouth/Throat: No trismus Neck: No stridor.   Respiratory: Normal respiratory effort.  No retractions. Gastrointestinal:  Neurologic: . No gross focal neurologic deficits are appreciated.  Skin:  Skin is warm, dry and intact. No rash noted.    ____________________________________________  LABS (all labs ordered are listed, but only abnormal results are displayed)  Labs Reviewed - No data to display   __________________________________________  EKG   ____________________________________________  RADIOLOGY   ____________________________________________   DIFFERENTIAL includes but not limited to  Alcohol intoxication, drug overdose, dehydration, intracerebral hemorrhage   PROCEDURES  Procedure(s) performed: no  Procedures  Critical Care performed: no  Observation: no ____________________________________________   INITIAL IMPRESSION / ASSESSMENT AND PLAN / ED COURSE  Pertinent labs & imaging results that were available  during my care of the patient were reviewed by me and considered in my medical decision making (see chart for details).  The patient is hemodynamically stable.  He has no medical complaints.  He is clearly intoxicated on alcohol.  As he is currently in police custody he is medically stable for discharge back to jail.      ____________________________________________   FINAL CLINICAL IMPRESSION(S) / ED DIAGNOSES  Final diagnoses:  Alcoholic  intoxication with complication (HCC)      NEW MEDICATIONS STARTED DURING THIS VISIT:  New Prescriptions   No medications on file     Note:  This document was prepared using Dragon voice recognition software and may include unintentional dictation errors.      Merrily Brittleifenbark, Gurpreet Mariani, MD 07/25/17 [phone]

## 2017-07-25 NOTE — Discharge Instructions (Signed)
YOU ARE MEDICALLY STABLE FOR BOOKING

## 2017-10-01 ENCOUNTER — Emergency Department
Admission: EM | Admit: 2017-10-01 | Discharge: 2017-10-01 | Disposition: A | Discharge status: Home / Self Care | Payer: Medicare Other | Attending: Emergency Medicine | Admitting: Emergency Medicine

## 2017-10-01 ENCOUNTER — Other Ambulatory Visit: Payer: Self-pay

## 2017-10-01 ENCOUNTER — Encounter: Payer: Self-pay | Admitting: Emergency Medicine

## 2017-10-01 DIAGNOSIS — Z79899 Other long term (current) drug therapy: Secondary | ICD-10-CM | POA: Insufficient documentation

## 2017-10-01 DIAGNOSIS — M79605 Pain in left leg: Secondary | ICD-10-CM | POA: Insufficient documentation

## 2017-10-01 DIAGNOSIS — M79604 Pain in right leg: Secondary | ICD-10-CM | POA: Insufficient documentation

## 2017-10-01 DIAGNOSIS — Z760 Encounter for issue of repeat prescription: Secondary | ICD-10-CM

## 2017-10-01 DIAGNOSIS — F17210 Nicotine dependence, cigarettes, uncomplicated: Secondary | ICD-10-CM | POA: Diagnosis not present

## 2017-10-01 NOTE — ED Notes (Signed)
Notified by charge nurse that patient was out at stat desk and he refused to be seen.

## 2017-10-01 NOTE — ED Provider Notes (Signed)
[facility] Emergency Department Provider Note   ____________________________________________   First MD Initiated Contact with Patient 10/01/17 0118     (approximate)  I have reviewed the triage vital signs and the nursing notes.   HISTORY  Chief Complaint Medication Refill    HPI Bobby Massey Route is a 52 y.o. male who comes into the hospital today stating that he needs a prescription.  He states that he has some mental health problem as well as pain in both of his legs.  When I walked into the room the patient states that he was pissed because no one would give him a phone to make a phone call.  The patient then stated he needed to see the previous doctor who wrote him his prescriptions.  He reports that his legs have metal in them and they have been broken.  I did asked the patient when the symptoms started and what was the name of his prescriptions.  The patient did not give me the name of his prescriptions but was very uncooperative.  He kept turning his head to the side and stating that he was pissed about not being able to make a phone call.  I did encourage the patient that I would let him make a phone call but I needed him to answer the questions in regards to what brought him to the emergency department.  The patient would not answer my questions.  He jumped up from the bed during my questioning.   Past Medical History:  Diagnosis Date  . Schizophrenia [facility])     Patient Active Problem List   Diagnosis Date Noted  . Schizophrenia (HCC) 05/16/2017    History reviewed. No pertinent surgical history.  Prior to Admission medications   Medication Sig Start Date End Date Taking? Authorizing Provider  busPIRone (BUSPAR) 10 MG tablet Take 10 mg by mouth 3 (three) times daily.    [provider]    Allergies Haldol [haloperidol]  No family history on file.  Social History Social History   Tobacco Use  . Smoking status: Current Every Day  Smoker    Packs/day: 0.50    Types: Cigarettes  . Smokeless tobacco: Never Used  Substance Use Topics  . Alcohol use: Yes    Frequency: Never  . Drug use: No    Review of Systems  Musculoskeletal: leg pain.  Patient refusing to answer questions  ____________________________________________   PHYSICAL EXAM:  VITAL SIGNS: ED Triage Vitals  Enc Vitals Group     BP 10/01/17 0044 (!) 142/76     Pulse Rate 10/01/17 0044 81     Resp 10/01/17 0044 18     Temp 10/01/17 0044 98.4 F (36.9 C)     Temp Source 10/01/17 0044 Oral     SpO2 10/01/17 0044 99 %     Weight 10/01/17 0046 176 lb (79.8 kg)     Height 10/01/17 0046 5\' 11"  (1.803 m)     Head Circumference --      Peak Flow --      Pain Score 10/01/17 0044 0     Pain Loc --      Pain Edu? --      Excl. in GC? --     Constitutional: Alert but non cooperative. Well appearing and no distress. Eyes: EOMI. Head: Atraumatic. Nose: No congestion. Mouth/Throat: Mucous membranes are moist. Cardiovascular: skin pink Respiratory: Normal respiratory effort Gastrointestinal:  No distention Musculoskeletal: No lower extremity swelling  Neurologic:  Normal speech and language.  Skin:  Skin is dry and intact.  Psychiatric: Patient not cooperative, patient not wanting to answer questions   ____________________________________________   LABS (all labs ordered are listed, but only abnormal results are displayed)  Labs Reviewed - No data to display ____________________________________________  EKG  none ____________________________________________  RADIOLOGY  ED MD interpretation:  none  Official radiology report(s): No results found.  ____________________________________________   PROCEDURES  Procedure(s) performed: None  Procedures  Critical Care performed: No  ____________________________________________   INITIAL IMPRESSION / ASSESSMENT AND PLAN / ED COURSE  As part of my medical decision making, I  reviewed the following data within the electronic MEDICAL RECORD NUMBER Notes from prior ED visits and [place] Controlled Substance Database   This is a 52 year old male who comes into the hospital today requesting a medication refill and stating that his lower extremities hurt.  As I was asking the patient questions he was becoming frustrated.  He states that we would not let him make a phone call and that he wanted to speak to his previous physician.  I informed him that I was the physician who was working this evening but I needed him to answer some questions before I would be able to provide him with medications.  The patient could not tell me what medications he lacked and when he ran out of medication.  He states that he wanted to be seen tonight because of his leg but he would not tell me when his leg started hurting or what was going on with his legs.  As I was speaking with the patient he jumped up and threw the blanket off of him and started putting on his shoes.  The patients decided he wanted to leave and would not answer any further questions.  The patient eloped from his room.      ____________________________________________   FINAL CLINICAL IMPRESSION(S) / ED DIAGNOSES  Final diagnoses:  Medication refill  Pain in both lower extremities     ED Discharge Orders    None       Note:  This document was prepared using Dragon voice recognition software and may include unintentional dictation errors.    Rebecka ApleyWebster, Allison P, MD 10/01/17 [phone]

## 2017-10-01 NOTE — ED Notes (Signed)
Pt states that he ran out of medication and would like a refill (Zoloft). Pt alert and oriented x 4.

## 2017-10-01 NOTE — ED Triage Notes (Signed)
Pt arrives via [facility] with c/o medication refill. Per pt, "I have run out of my meds and need a new prescription". Pt given ride by [facility] but under no commitment. Pt denies any need for psychiatric help and denies SI and HI at this time.

## 2017-10-16 ENCOUNTER — Encounter (HOSPITAL_COMMUNITY): Payer: Self-pay | Admitting: Emergency Medicine

## 2017-10-16 ENCOUNTER — Emergency Department (HOSPITAL_COMMUNITY): Payer: Medicare Other

## 2017-10-16 ENCOUNTER — Inpatient Hospital Stay (HOSPITAL_COMMUNITY)
Admission: EM | Admit: 2017-10-16 | Discharge: 2017-10-29 | DRG: 551 | Disposition: A | Discharge status: Skilled Nursing Facility | Payer: Medicare Other | Attending: General Surgery | Admitting: General Surgery

## 2017-10-16 DIAGNOSIS — Z9114 Patient's other noncompliance with medication regimen: Secondary | ICD-10-CM

## 2017-10-16 DIAGNOSIS — R402122 Coma scale, eyes open, to pain, at arrival to emergency department: Secondary | ICD-10-CM | POA: Diagnosis present

## 2017-10-16 DIAGNOSIS — Z23 Encounter for immunization: Secondary | ICD-10-CM

## 2017-10-16 DIAGNOSIS — R402212 Coma scale, best verbal response, none, at arrival to emergency department: Secondary | ICD-10-CM | POA: Diagnosis present

## 2017-10-16 DIAGNOSIS — S42111A Displaced fracture of body of scapula, right shoulder, initial encounter for closed fracture: Secondary | ICD-10-CM | POA: Diagnosis present

## 2017-10-16 DIAGNOSIS — S12600A Unspecified displaced fracture of seventh cervical vertebra, initial encounter for closed fracture: Principal | ICD-10-CM | POA: Diagnosis present

## 2017-10-16 DIAGNOSIS — Y903 Blood alcohol level of 60-79 mg/100 ml: Secondary | ICD-10-CM | POA: Diagnosis present

## 2017-10-16 DIAGNOSIS — M25511 Pain in right shoulder: Secondary | ICD-10-CM | POA: Diagnosis present

## 2017-10-16 DIAGNOSIS — S22029A Unspecified fracture of second thoracic vertebra, initial encounter for closed fracture: Secondary | ICD-10-CM | POA: Diagnosis present

## 2017-10-16 DIAGNOSIS — Z751 Person awaiting admission to adequate facility elsewhere: Secondary | ICD-10-CM

## 2017-10-16 DIAGNOSIS — S22049A Unspecified fracture of fourth thoracic vertebra, initial encounter for closed fracture: Secondary | ICD-10-CM | POA: Diagnosis present

## 2017-10-16 DIAGNOSIS — M4802 Spinal stenosis, cervical region: Secondary | ICD-10-CM | POA: Diagnosis present

## 2017-10-16 DIAGNOSIS — Y9301 Activity, walking, marching and hiking: Secondary | ICD-10-CM | POA: Diagnosis present

## 2017-10-16 DIAGNOSIS — Z590 Homelessness: Secondary | ICD-10-CM

## 2017-10-16 DIAGNOSIS — S22009A Unspecified fracture of unspecified thoracic vertebra, initial encounter for closed fracture: Secondary | ICD-10-CM

## 2017-10-16 DIAGNOSIS — F209 Schizophrenia, unspecified: Secondary | ICD-10-CM

## 2017-10-16 DIAGNOSIS — S32039A Unspecified fracture of third lumbar vertebra, initial encounter for closed fracture: Secondary | ICD-10-CM | POA: Diagnosis present

## 2017-10-16 DIAGNOSIS — S060X9A Concussion with loss of consciousness of unspecified duration, initial encounter: Secondary | ICD-10-CM | POA: Diagnosis present

## 2017-10-16 DIAGNOSIS — S129XXA Fracture of neck, unspecified, initial encounter: Secondary | ICD-10-CM

## 2017-10-16 DIAGNOSIS — S0003XA Contusion of scalp, initial encounter: Secondary | ICD-10-CM

## 2017-10-16 DIAGNOSIS — S0101XA Laceration without foreign body of scalp, initial encounter: Secondary | ICD-10-CM | POA: Diagnosis present

## 2017-10-16 DIAGNOSIS — R451 Restlessness and agitation: Secondary | ICD-10-CM | POA: Diagnosis present

## 2017-10-16 DIAGNOSIS — R402352 Coma scale, best motor response, localizes pain, at arrival to emergency department: Secondary | ICD-10-CM | POA: Diagnosis present

## 2017-10-16 DIAGNOSIS — S32029A Unspecified fracture of second lumbar vertebra, initial encounter for closed fracture: Secondary | ICD-10-CM | POA: Diagnosis present

## 2017-10-16 DIAGNOSIS — F10129 Alcohol abuse with intoxication, unspecified: Secondary | ICD-10-CM | POA: Diagnosis present

## 2017-10-16 DIAGNOSIS — Z789 Other specified health status: Secondary | ICD-10-CM

## 2017-10-16 DIAGNOSIS — Z888 Allergy status to other drugs, medicaments and biological substances status: Secondary | ICD-10-CM

## 2017-10-16 DIAGNOSIS — J9601 Acute respiratory failure with hypoxia: Secondary | ICD-10-CM | POA: Diagnosis present

## 2017-10-16 DIAGNOSIS — E872 Acidosis: Secondary | ICD-10-CM | POA: Diagnosis present

## 2017-10-16 DIAGNOSIS — Y92410 Unspecified street and highway as the place of occurrence of the external cause: Secondary | ICD-10-CM

## 2017-10-16 DIAGNOSIS — R339 Retention of urine, unspecified: Secondary | ICD-10-CM | POA: Diagnosis present

## 2017-10-16 HISTORY — DX: Other injury of unspecified body region, initial encounter: T14.8XXA

## 2017-10-16 LAB — CBC
HEMATOCRIT: 42.6 % (ref 39.0–52.0)
Hemoglobin: 14.2 g/dL (ref 13.0–17.0)
MCH: 32.4 pg (ref 26.0–34.0)
MCHC: 33.3 g/dL (ref 30.0–36.0)
MCV: 97.3 fL (ref 78.0–100.0)
Platelets: 188 10*3/uL (ref 150–400)
RBC: 4.38 MIL/uL (ref 4.22–5.81)
RDW: 13 % (ref 11.5–15.5)
WBC: 9.8 10*3/uL (ref 4.0–10.5)

## 2017-10-16 LAB — I-STAT CHEM 8, ED
BUN: 11 mg/dL (ref 6–20)
CREATININE: 0.9 mg/dL (ref 0.61–1.24)
Calcium, Ion: 1.11 mmol/L — ABNORMAL LOW (ref 1.15–1.40)
Chloride: 102 mmol/L (ref 98–111)
Glucose, Bld: 168 mg/dL — ABNORMAL HIGH (ref 70–99)
HEMATOCRIT: 43 % (ref 39.0–52.0)
HEMOGLOBIN: 14.6 g/dL (ref 13.0–17.0)
Potassium: 3.6 mmol/L (ref 3.5–5.1)
Sodium: 139 mmol/L (ref 135–145)
TCO2: 20 mmol/L — AB (ref 22–32)

## 2017-10-16 LAB — COMPREHENSIVE METABOLIC PANEL
ALT: 22 U/L (ref 0–44)
AST: 34 U/L (ref 15–41)
Albumin: 3.6 g/dL (ref 3.5–5.0)
Alkaline Phosphatase: 72 U/L (ref 38–126)
Anion gap: 10 (ref 5–15)
BUN: 9 mg/dL (ref 6–20)
CHLORIDE: 105 mmol/L (ref 98–111)
CO2: 20 mmol/L — AB (ref 22–32)
Calcium: 8.6 mg/dL — ABNORMAL LOW (ref 8.9–10.3)
Creatinine, Ser: 0.89 mg/dL (ref 0.61–1.24)
Glucose, Bld: 163 mg/dL — ABNORMAL HIGH (ref 70–99)
POTASSIUM: 3.6 mmol/L (ref 3.5–5.1)
SODIUM: 135 mmol/L (ref 135–145)
Total Bilirubin: 0.7 mg/dL (ref 0.3–1.2)
Total Protein: 6.1 g/dL — ABNORMAL LOW (ref 6.5–8.1)

## 2017-10-16 LAB — PROTIME-INR
INR: 1.1
PROTHROMBIN TIME: 14.1 s (ref 11.4–15.2)

## 2017-10-16 LAB — SAMPLE TO BLOOD BANK

## 2017-10-16 LAB — CDS SEROLOGY

## 2017-10-16 LAB — I-STAT CG4 LACTIC ACID, ED: Lactic Acid, Venous: 2.32 mmol/L (ref 0.5–1.9)

## 2017-10-16 LAB — ETHANOL: Alcohol, Ethyl (B): 104 mg/dL — ABNORMAL HIGH (ref <10)

## 2017-10-16 MED ORDER — FENTANYL CITRATE (PF) 100 MCG/2ML IJ SOLN
INTRAMUSCULAR | Status: AC | PRN
  Administered 2017-10-16: 50 ug via INTRAVENOUS

## 2017-10-16 MED ORDER — FENTANYL CITRATE (PF) 100 MCG/2ML IJ SOLN
50.0000 ug | Freq: Once | INTRAMUSCULAR | Status: AC
  Administered 2017-10-16: 50 ug via INTRAVENOUS
  Filled 2017-10-16: qty 2

## 2017-10-16 MED ORDER — SODIUM CHLORIDE 0.9 % IV SOLN
INTRAVENOUS | Status: AC | PRN
  Administered 2017-10-16: 1000 mL via INTRAVENOUS

## 2017-10-16 MED ORDER — MIDAZOLAM HCL 2 MG/2ML IJ SOLN
INTRAMUSCULAR | Status: AC
  Filled 2017-10-16: qty 4

## 2017-10-16 MED ORDER — PROPOFOL 1000 MG/100ML IV EMUL
INTRAVENOUS | Status: AC
  Filled 2017-10-16: qty 100

## 2017-10-16 MED ORDER — FENTANYL 2500MCG IN NS 250ML (10MCG/ML) PREMIX INFUSION
30.0000 ug/h | INTRAVENOUS | Status: DC
  Administered 2017-10-17: 30 ug/h via INTRAVENOUS
  Filled 2017-10-16 (×2): qty 250

## 2017-10-16 MED ORDER — PROPOFOL 1000 MG/100ML IV EMUL: 5.0000 ug/kg/min | Freq: Once | INTRAVENOUS | Status: DC

## 2017-10-16 MED ORDER — PROPOFOL 1000 MG/100ML IV EMUL
5.0000 ug/kg/min | Freq: Once | INTRAVENOUS | Status: AC
  Administered 2017-10-16: 10 ug/kg/min via INTRAVENOUS

## 2017-10-16 MED ORDER — FENTANYL CITRATE (PF) 100 MCG/2ML IJ SOLN
INTRAMUSCULAR | Status: AC
  Administered 2017-10-16: 50 ug via INTRAVENOUS
  Filled 2017-10-16: qty 2

## 2017-10-16 MED ORDER — IOHEXOL 300 MG/ML  SOLN
100.0000 mL | Freq: Once | INTRAMUSCULAR | Status: AC | PRN
  Administered 2017-10-16: 100 mL via INTRAVENOUS

## 2017-10-16 MED ORDER — KETAMINE HCL 50 MG/5ML IJ SOSY
90.0000 mg | PREFILLED_SYRINGE | Freq: Once | INTRAMUSCULAR | Status: AC
  Administered 2017-10-16: 90 mg via INTRAVENOUS
  Filled 2017-10-16: qty 10

## 2017-10-16 MED ORDER — KETAMINE HCL 50 MG/5ML IJ SOSY
50.0000 mg | PREFILLED_SYRINGE | Freq: Once | INTRAMUSCULAR | Status: AC
  Administered 2017-10-16: 50 mg via INTRAVENOUS
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
## 2017-10-17 ENCOUNTER — Inpatient Hospital Stay (HOSPITAL_COMMUNITY): Payer: Medicare Other

## 2017-10-17 ENCOUNTER — Encounter (HOSPITAL_COMMUNITY): Payer: Self-pay | Admitting: Neurological Surgery

## 2017-10-17 DIAGNOSIS — Z751 Person awaiting admission to adequate facility elsewhere: Secondary | ICD-10-CM | POA: Diagnosis not present

## 2017-10-17 DIAGNOSIS — S22049A Unspecified fracture of fourth thoracic vertebra, initial encounter for closed fracture: Secondary | ICD-10-CM | POA: Diagnosis present

## 2017-10-17 DIAGNOSIS — R339 Retention of urine, unspecified: Secondary | ICD-10-CM | POA: Diagnosis present

## 2017-10-17 DIAGNOSIS — Z9114 Patient's other noncompliance with medication regimen: Secondary | ICD-10-CM | POA: Diagnosis not present

## 2017-10-17 DIAGNOSIS — F209 Schizophrenia, unspecified: Secondary | ICD-10-CM | POA: Diagnosis present

## 2017-10-17 DIAGNOSIS — R402212 Coma scale, best verbal response, none, at arrival to emergency department: Secondary | ICD-10-CM | POA: Diagnosis present

## 2017-10-17 DIAGNOSIS — R402352 Coma scale, best motor response, localizes pain, at arrival to emergency department: Secondary | ICD-10-CM | POA: Diagnosis present

## 2017-10-17 DIAGNOSIS — R402122 Coma scale, eyes open, to pain, at arrival to emergency department: Secondary | ICD-10-CM | POA: Diagnosis present

## 2017-10-17 DIAGNOSIS — S0101XA Laceration without foreign body of scalp, initial encounter: Secondary | ICD-10-CM | POA: Diagnosis present

## 2017-10-17 DIAGNOSIS — Z590 Homelessness: Secondary | ICD-10-CM | POA: Diagnosis not present

## 2017-10-17 DIAGNOSIS — Z23 Encounter for immunization: Secondary | ICD-10-CM | POA: Diagnosis present

## 2017-10-17 DIAGNOSIS — Y9301 Activity, walking, marching and hiking: Secondary | ICD-10-CM | POA: Diagnosis not present

## 2017-10-17 DIAGNOSIS — Z888 Allergy status to other drugs, medicaments and biological substances status: Secondary | ICD-10-CM | POA: Diagnosis not present

## 2017-10-17 DIAGNOSIS — S12600A Unspecified displaced fracture of seventh cervical vertebra, initial encounter for closed fracture: Secondary | ICD-10-CM | POA: Diagnosis present

## 2017-10-17 DIAGNOSIS — R451 Restlessness and agitation: Secondary | ICD-10-CM | POA: Diagnosis present

## 2017-10-17 DIAGNOSIS — S42111A Displaced fracture of body of scapula, right shoulder, initial encounter for closed fracture: Secondary | ICD-10-CM | POA: Diagnosis present

## 2017-10-17 DIAGNOSIS — M4802 Spinal stenosis, cervical region: Secondary | ICD-10-CM | POA: Diagnosis present

## 2017-10-17 DIAGNOSIS — Y903 Blood alcohol level of 60-79 mg/100 ml: Secondary | ICD-10-CM | POA: Diagnosis not present

## 2017-10-17 DIAGNOSIS — J9601 Acute respiratory failure with hypoxia: Secondary | ICD-10-CM | POA: Diagnosis present

## 2017-10-17 DIAGNOSIS — S32039A Unspecified fracture of third lumbar vertebra, initial encounter for closed fracture: Secondary | ICD-10-CM | POA: Diagnosis present

## 2017-10-17 DIAGNOSIS — S32029A Unspecified fracture of second lumbar vertebra, initial encounter for closed fracture: Secondary | ICD-10-CM | POA: Diagnosis present

## 2017-10-17 DIAGNOSIS — F10129 Alcohol abuse with intoxication, unspecified: Secondary | ICD-10-CM | POA: Diagnosis present

## 2017-10-17 DIAGNOSIS — S22029A Unspecified fracture of second thoracic vertebra, initial encounter for closed fracture: Secondary | ICD-10-CM | POA: Diagnosis present

## 2017-10-17 DIAGNOSIS — M25511 Pain in right shoulder: Secondary | ICD-10-CM | POA: Diagnosis present

## 2017-10-17 DIAGNOSIS — Y92410 Unspecified street and highway as the place of occurrence of the external cause: Secondary | ICD-10-CM | POA: Diagnosis not present

## 2017-10-17 DIAGNOSIS — F200 Paranoid schizophrenia: Secondary | ICD-10-CM | POA: Diagnosis not present

## 2017-10-17 DIAGNOSIS — E872 Acidosis: Secondary | ICD-10-CM | POA: Diagnosis present

## 2017-10-17 DIAGNOSIS — S060X9A Concussion with loss of consciousness of unspecified duration, initial encounter: Secondary | ICD-10-CM | POA: Diagnosis present

## 2017-10-17 LAB — BASIC METABOLIC PANEL
ANION GAP: 10 (ref 5–15)
BUN: 6 mg/dL (ref 6–20)
CALCIUM: 8.3 mg/dL — AB (ref 8.9–10.3)
CO2: 23 mmol/L (ref 22–32)
CREATININE: 0.63 mg/dL (ref 0.61–1.24)
Chloride: 106 mmol/L (ref 98–111)
GLUCOSE: 109 mg/dL — AB (ref 70–99)
Potassium: 3.6 mmol/L (ref 3.5–5.1)
Sodium: 139 mmol/L (ref 135–145)

## 2017-10-17 LAB — I-STAT ARTERIAL BLOOD GAS, ED
Acid-base deficit: 6 mmol/L — ABNORMAL HIGH (ref 0.0–2.0)
Bicarbonate: 19.7 mmol/L — ABNORMAL LOW (ref 20.0–28.0)
O2 Saturation: 99 %
PCO2 ART: 39.1 mmHg (ref 32.0–48.0)
TCO2: 21 mmol/L — ABNORMAL LOW (ref 22–32)
pH, Arterial: 7.31 — ABNORMAL LOW (ref 7.350–7.450)
pO2, Arterial: 145 mmHg — ABNORMAL HIGH (ref 83.0–108.0)

## 2017-10-17 LAB — HIV ANTIBODY (ROUTINE TESTING W REFLEX): HIV SCREEN 4TH GENERATION: NONREACTIVE

## 2017-10-17 LAB — CBC
HCT: 42.4 % (ref 39.0–52.0)
Hemoglobin: 14.3 g/dL (ref 13.0–17.0)
MCH: 31.7 pg (ref 26.0–34.0)
MCHC: 33.7 g/dL (ref 30.0–36.0)
MCV: 94 fL (ref 78.0–100.0)
PLATELETS: 195 10*3/uL (ref 150–400)
RBC: 4.51 MIL/uL (ref 4.22–5.81)
RDW: 12.7 % (ref 11.5–15.5)
WBC: 9 10*3/uL (ref 4.0–10.5)

## 2017-10-17 LAB — TRIGLYCERIDES: TRIGLYCERIDES: 77 mg/dL (ref <150)

## 2017-10-17 LAB — GLUCOSE, CAPILLARY: GLUCOSE-CAPILLARY: 95 mg/dL (ref 70–99)

## 2017-10-17 LAB — MRSA PCR SCREENING: MRSA by PCR: POSITIVE — AB

## 2017-10-17 MED ORDER — PROCHLORPERAZINE MALEATE 10 MG PO TABS
10.0000 mg | ORAL_TABLET | Freq: Four times a day (QID) | ORAL | Status: DC | PRN
  Filled 2017-10-17: qty 1

## 2017-10-17 MED ORDER — PROCHLORPERAZINE EDISYLATE 10 MG/2ML IJ SOLN: 5.0000 mg | Freq: Four times a day (QID) | INTRAMUSCULAR | Status: DC | PRN

## 2017-10-17 MED ORDER — SODIUM CHLORIDE 0.9 % IV SOLN
1.0000 mg | Freq: Once | INTRAVENOUS | Status: AC
  Administered 2017-10-17: 1 mg via INTRAVENOUS
  Filled 2017-10-17: qty 0.2

## 2017-10-17 MED ORDER — ETOMIDATE 2 MG/ML IV SOLN
INTRAVENOUS | Status: AC | PRN
  Administered 2017-10-16: 20 mg via INTRAVENOUS

## 2017-10-17 MED ORDER — THIAMINE HCL 100 MG/ML IJ SOLN
100.0000 mg | Freq: Every day | INTRAMUSCULAR | Status: DC
  Administered 2017-10-17 – 2017-10-19 (×3): 100 mg via INTRAVENOUS
  Filled 2017-10-17 (×3): qty 2

## 2017-10-17 MED ORDER — ORAL CARE MOUTH RINSE
15.0000 mL | OROMUCOSAL | Status: DC
  Administered 2017-10-17 – 2017-10-18 (×14): 15 mL via OROMUCOSAL

## 2017-10-17 MED ORDER — FENTANYL BOLUS VIA INFUSION
50.0000 ug | INTRAVENOUS | Status: DC | PRN
  Filled 2017-10-17: qty 50

## 2017-10-17 MED ORDER — FENTANYL CITRATE (PF) 100 MCG/2ML IJ SOLN: 50.0000 ug | Freq: Once | INTRAMUSCULAR | Status: DC

## 2017-10-17 MED ORDER — MUPIROCIN 2 % EX OINT
1.0000 "application " | TOPICAL_OINTMENT | Freq: Two times a day (BID) | CUTANEOUS | Status: AC
  Administered 2017-10-17 – 2017-10-21 (×10): 1 via NASAL
  Filled 2017-10-17 (×2): qty 22

## 2017-10-17 MED ORDER — BISACODYL 10 MG RE SUPP: 10.0000 mg | Freq: Every day | RECTAL | Status: DC | PRN

## 2017-10-17 MED ORDER — CHLORHEXIDINE GLUCONATE CLOTH 2 % EX PADS
6.0000 | MEDICATED_PAD | Freq: Every day | CUTANEOUS | Status: DC
  Administered 2017-10-17: 6 via TOPICAL

## 2017-10-17 MED ORDER — PANTOPRAZOLE SODIUM 40 MG PO TBEC
40.0000 mg | DELAYED_RELEASE_TABLET | Freq: Two times a day (BID) | ORAL | Status: DC
  Administered 2017-10-17 – 2017-10-29 (×22): 40 mg via ORAL
  Filled 2017-10-17 (×23): qty 1

## 2017-10-17 MED ORDER — ROCURONIUM BROMIDE 50 MG/5ML IV SOLN
INTRAVENOUS | Status: AC | PRN
  Administered 2017-10-16: 100 mg via INTRAVENOUS

## 2017-10-17 MED ORDER — DOCUSATE SODIUM 100 MG PO CAPS: 100.0000 mg | ORAL_CAPSULE | Freq: Two times a day (BID) | ORAL | Status: DC

## 2017-10-17 MED ORDER — MIDAZOLAM HCL 5 MG/5ML IJ SOLN
INTRAMUSCULAR | Status: AC | PRN
  Administered 2017-10-16: 2 mg via INTRAVENOUS

## 2017-10-17 MED ORDER — SODIUM CHLORIDE 0.9 % IV SOLN
INTRAVENOUS | Status: DC
  Administered 2017-10-17 – 2017-10-20 (×8): via INTRAVENOUS

## 2017-10-17 MED ORDER — DEXMEDETOMIDINE HCL IN NACL 400 MCG/100ML IV SOLN
0.4000 ug/kg/h | INTRAVENOUS | Status: DC
  Administered 2017-10-17: 0.4 ug/kg/h via INTRAVENOUS
  Administered 2017-10-17: 0.8 ug/kg/h via INTRAVENOUS
  Administered 2017-10-17: 0.6 ug/kg/h via INTRAVENOUS
  Filled 2017-10-17 (×2): qty 100

## 2017-10-17 MED ORDER — ALBUMIN HUMAN 25 % IV SOLN
12.5000 g | Freq: Once | INTRAVENOUS | Status: AC
  Administered 2017-10-17: 12.5 g via INTRAVENOUS
  Filled 2017-10-17: qty 100

## 2017-10-17 MED ORDER — BUSPIRONE HCL 10 MG PO TABS
10.0000 mg | ORAL_TABLET | Freq: Three times a day (TID) | ORAL | Status: DC
  Administered 2017-10-17 – 2017-10-18 (×3): 10 mg
  Filled 2017-10-17 (×6): qty 1

## 2017-10-17 MED ORDER — GADOBENATE DIMEGLUMINE 529 MG/ML IV SOLN
20.0000 mL | Freq: Once | INTRAVENOUS | Status: AC
  Administered 2017-10-17: 20 mL via INTRAVENOUS

## 2017-10-17 MED ORDER — FENTANYL 2500MCG IN NS 250ML (10MCG/ML) PREMIX INFUSION
25.0000 ug/h | INTRAVENOUS | Status: DC
  Administered 2017-10-17: 50 ug/h via INTRAVENOUS
  Administered 2017-10-17: 100 ug/h via INTRAVENOUS
  Administered 2017-10-18: 200 ug/h via INTRAVENOUS
  Filled 2017-10-17: qty 250

## 2017-10-17 MED ORDER — PROPOFOL 1000 MG/100ML IV EMUL
0.0000 ug/kg/min | INTRAVENOUS | Status: DC
  Administered 2017-10-17 (×2): 5 ug/kg/min via INTRAVENOUS
  Administered 2017-10-17 – 2017-10-18 (×2): 20 ug/kg/min via INTRAVENOUS
  Filled 2017-10-17 (×4): qty 100

## 2017-10-17 MED ORDER — CHLORHEXIDINE GLUCONATE 0.12% ORAL RINSE (MEDLINE KIT)
15.0000 mL | Freq: Two times a day (BID) | OROMUCOSAL | Status: DC
  Administered 2017-10-17 – 2017-10-18 (×3): 15 mL via OROMUCOSAL

## 2017-10-17 MED ORDER — FAMOTIDINE IN NACL 20-0.9 MG/50ML-% IV SOLN
20.0000 mg | Freq: Two times a day (BID) | INTRAVENOUS | Status: DC
  Administered 2017-10-17 – 2017-10-18 (×4): 20 mg via INTRAVENOUS
  Filled 2017-10-17 (×4): qty 50

## 2017-10-17 NOTE — Progress Notes (Signed)
Patient seen and examined. MRI reviewed. CT reviewed again. He seems to have good strength in the upper and lower extremities. He complains of right scapular pain which radiates into the right upper arm. There is some limitation of right arm movement but appears to be related to the scapula. There is some decreased flexion of the bicep and extension at the tricep but both of those were at least antigravity and his tricep was better than that at least 4 out of 5. Cannot really test the deltoid. Has good hand grip and wrist extension. He denies numbness or tingling in the arms or hands. The left upper extremity has 5 out of 5 strength except for the tricep which seems to be 4 out of 5. MRI does show hematoma and bone fragment causing canal narrowing to about 6 mm but no signal change in the cord. he certainly does not have a physical exam consistent with spinal cord injury or spinal cord compromise.  At this point I think I'm not going to recommend surgical intervention. I think that the positioning for surgery, and the extensive nature of the surgery requiring cervical fusion from C3-T2 most likely given the extensive nature of his different fractures makes the surgery more risky than observation in a collar. The risk of neurologic compromise is likely lower with treatment in a cervical collar though the risk of both is likely low.  He certainly could develop glacial instability at the fracture sites requiring internal fixation in the future, but even then the risk of the surgery may be less since the hematoma would likely resolve and that would leave less canal narrowing.  Would wean to extubate, follow neuro exam and keep in a collar for now.
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
## 2017-10-17 NOTE — Progress Notes (Addendum)
Patient ID: Bobby Massey, male   DOB: 09-27-1965, 52 y.o.   MRN: [id number] Follow up - Trauma Critical Care  Patient Details:    Bobby Massey is an 52 y.o. male.  Lines/tubes : Airway (Active)  Secured at (cm) 26 cm 10/17/2017  4:38 AM  Measured From Lips 10/17/2017  4:38 AM  Secured Location Left 10/17/2017  4:38 AM  Secured By [employer] T[employer] 10/17/2017  4:38 AM  Tube Holder Repositioned Yes 10/17/2017  4:38 AM  Cuff Pressure (cm H2O) 28 cm H2O 10/17/2017  1:54 AM  Site Condition Dry 10/16/2017 11:00 PM     Urethral Catheter Arlys JohnBrian RN  Temperature probe 16 Fr. (Active)  Indication for Insertion or Continuance of Catheter Unstable spinal/crush injuries 10/17/2017  2:00 AM  Site Assessment Clean;Intact 10/17/2017  2:00 AM  Catheter Maintenance Bag below level of bladder;Catheter secured;Drainage bag/tubing not touching floor;Seal intact;No dependent loops;Insertion date on drainage bag;Bag emptied prior to transport 10/17/2017  2:00 AM  Collection Container Standard drainage bag 10/17/2017  2:00 AM  Securement Method Securing device (Describe) 10/17/2017  2:00 AM  Urinary Catheter Interventions Unclamped 10/17/2017  2:00 AM  Output (mL) 392 mL 10/17/2017  6:00 AM    Microbiology/Sepsis markers: Results for orders placed or performed during the hospital encounter of 10/16/17  MRSA PCR Screening     Status: Abnormal   Collection Time: 10/17/17  2:24 AM  Result Value Ref Range Status   MRSA by PCR POSITIVE (A) NEGATIVE Final    Comment:        The GeneXpert MRSA Assay (FDA approved for NASAL specimens only), is one component of a comprehensive MRSA colonization surveillance program. It is not intended to diagnose MRSA infection nor to guide or monitor treatment for MRSA infections. RESULT CALLED TO, READ BACK BY AND VERIFIED WITH: Graceann CongressWASHINGTON,R RN [id number] 10/17/17 MITCHELL,L     Anti-infectives:  Anti-infectives (From admission, onward)   None      Best Practice/Protocols:   VTE Prophylaxis: Mechanical Continous Sedation  Consults: Treatment Team:  Tia AlertJones, David S, MD    Studies:    Events:  Subjective:    Overnight Issues:   Objective:  Vital signs for last 24 hours: Temp:  [97.2 F (36.2 C)-98 F (36.7 C)] 97.2 F (36.2 C) (07/10 0400) Pulse Rate:  [65-109] 66 (07/10 0700) Resp:  [11-22] 20 (07/10 0700) BP: (85-191)/(55-108) 112/77 (07/10 0700) SpO2:  [85 %-100 %] 100 % (07/10 0700) FiO2 (%):  [60 %] 60 % (07/10 0700) Weight:  [90.7 kg (200 lb)-91.6 kg (201 lb 15.1 oz)] 91.6 kg (201 lb 15.1 oz) (07/10 0154)  Hemodynamic parameters for last 24 hours:    Intake/Output from previous day: 07/09 0701 - 07/10 0700 In: 1799.7 [I.V.:1699.7; IV Piggyback:100] Out: 3076 [Urine:3076]  Intake/Output this shift: No intake/output data recorded.  Vent settings for last 24 hours: Vent Mode: PRVC FiO2 (%):  [60 %] 60 % Set Rate:  [20 bmp] 20 bmp Vt Set:  [600 mL] 600 mL PEEP:  [5 cmH20] 5 cmH20 Plateau Pressure:  [14 cmH20-15 cmH20] 14 cmH20  Physical Exam:  General: on vent Neuro: arouses and F/C to move all ext HEENT/Neck: ETT and abrasion on top of scalp Resp: clear to auscultation bilaterally CVS: RRR GI: soft, nontender, BS WNL, no r/g Extremities: no edema, no erythema, pulses WNL  Results for orders placed or performed during the hospital encounter of 10/16/17 (from the past 24 hour(s))  Ethanol     Status: Abnormal  Collection Time: 10/16/17  6:42 PM  Result Value Ref Range   Alcohol, Ethyl (B) 104 (H) <10 mg/dL  I-Stat Chem 8, ED     Status: Abnormal   Collection Time: 10/16/17  6:52 PM  Result Value Ref Range   Sodium 139 135 - 145 mmol/L   Potassium 3.6 3.5 - 5.1 mmol/L   Chloride 102 98 - 111 mmol/L   BUN 11 6 - 20 mg/dL   Creatinine, Ser 0.98 0.61 - 1.24 mg/dL   Glucose, Bld 119 (H) 70 - 99 mg/dL   Calcium, Ion 1.47 (L) 1.15 - 1.40 mmol/L   TCO2 20 (L) 22 - 32 mmol/L   Hemoglobin 14.6 13.0 - 17.0 g/dL   HCT 82.9  56.2 - 13.0 %  I-Stat CG4 Lactic Acid, ED     Status: Abnormal   Collection Time: 10/16/17  6:52 PM  Result Value Ref Range   Lactic Acid, Venous 2.32 (HH) 0.5 - 1.9 mmol/L   Comment NOTIFIED PHYSICIAN   CDS serology     Status: None   Collection Time: 10/16/17  6:55 PM  Result Value Ref Range   CDS serology specimen      SPECIMEN WILL BE HELD FOR 14 DAYS IF TESTING IS REQUIRED  Comprehensive metabolic panel     Status: Abnormal   Collection Time: 10/16/17  6:55 PM  Result Value Ref Range   Sodium 135 135 - 145 mmol/L   Potassium 3.6 3.5 - 5.1 mmol/L   Chloride 105 98 - 111 mmol/L   CO2 20 (L) 22 - 32 mmol/L   Glucose, Bld 163 (H) 70 - 99 mg/dL   BUN 9 6 - 20 mg/dL   Creatinine, Ser 8.65 0.61 - 1.24 mg/dL   Calcium 8.6 (L) 8.9 - 10.3 mg/dL   Total Protein 6.1 (L) 6.5 - 8.1 g/dL   Albumin 3.6 3.5 - 5.0 g/dL   AST 34 15 - 41 U/L   ALT 22 0 - 44 U/L   Alkaline Phosphatase 72 38 - 126 U/L   Total Bilirubin 0.7 0.3 - 1.2 mg/dL   GFR calc non Af Amer >60 >60 mL/min   GFR calc Af Amer >60 >60 mL/min   Anion gap 10 5 - 15  CBC     Status: None   Collection Time: 10/16/17  6:55 PM  Result Value Ref Range   WBC 9.8 4.0 - 10.5 K/uL   RBC 4.38 4.22 - 5.81 MIL/uL   Hemoglobin 14.2 13.0 - 17.0 g/dL   HCT 78.4 69.6 - 29.5 %   MCV 97.3 78.0 - 100.0 fL   MCH 32.4 26.0 - 34.0 pg   MCHC 33.3 30.0 - 36.0 g/dL   RDW 28.4 13.2 - 44.0 %   Platelets 188 150 - 400 K/uL  Protime-INR     Status: None   Collection Time: 10/16/17  6:55 PM  Result Value Ref Range   Prothrombin Time 14.1 11.4 - 15.2 seconds   INR 1.10   Sample to Blood Bank     Status: None   Collection Time: 10/16/17  6:55 PM  Result Value Ref Range   Blood Bank Specimen SAMPLE AVAILABLE FOR TESTING    Sample Expiration      10/17/2017 Performed at [facility], [address], [place], [place] [address]   I-Stat arterial blood gas, ED     Status: Abnormal   Collection Time: 10/17/17 12:36 AM  Result Value Ref Range  pH, Arterial 7.310 (L) 7.350 - 7.450   pCO2 arterial 39.1 32.0 - 48.0 mmHg   pO2, Arterial 145.0 (H) 83.0 - 108.0 mmHg   Bicarbonate 19.7 (L) 20.0 - 28.0 mmol/L   TCO2 21 (L) 22 - 32 mmol/L   O2 Saturation 99.0 %   Acid-base deficit 6.0 (H) 0.0 - 2.0 mmol/L   Patient temperature HIDE    Sample type ARTERIAL   MRSA PCR Screening     Status: Abnormal   Collection Time: 10/17/17  2:24 AM  Result Value Ref Range   MRSA by PCR POSITIVE (A) NEGATIVE  CBC     Status: None   Collection Time: 10/17/17  2:31 AM  Result Value Ref Range   WBC 9.0 4.0 - 10.5 K/uL   RBC 4.51 4.22 - 5.81 MIL/uL   Hemoglobin 14.3 13.0 - 17.0 g/dL   HCT 16.1 09.6 - 04.5 %   MCV 94.0 78.0 - 100.0 fL   MCH 31.7 26.0 - 34.0 pg   MCHC 33.7 30.0 - 36.0 g/dL   RDW 40.9 81.1 - 91.4 %   Platelets 195 150 - 400 K/uL  Basic metabolic panel     Status: Abnormal   Collection Time: 10/17/17  2:31 AM  Result Value Ref Range   Sodium 139 135 - 145 mmol/L   Potassium 3.6 3.5 - 5.1 mmol/L   Chloride 106 98 - 111 mmol/L   CO2 23 22 - 32 mmol/L   Glucose, Bld 109 (H) 70 - 99 mg/dL   BUN 6 6 - 20 mg/dL   Creatinine, Ser 7.82 0.61 - 1.24 mg/dL   Calcium 8.3 (L) 8.9 - 10.3 mg/dL   GFR calc non Af Amer >60 >60 mL/min   GFR calc Af Amer >60 >60 mL/min   Anion gap 10 5 - 15  Triglycerides     Status: None   Collection Time: 10/17/17  2:31 AM  Result Value Ref Range   Triglycerides 77 <150 mg/dL  Glucose, capillary     Status: None   Collection Time: 10/17/17  3:28 AM  Result Value Ref Range   Glucose-Capillary 95 70 - 99 mg/dL    Assessment & Plan: Present on Admission: **None**    LOS: 0 days   Additional comments:I reviewed the patient's new clinical lab test results. Marland Kitchen PHBC B C7 lamina FX through C7 body - cervical MR P per Dr. Yetta Barre. Collar. May need intervention. T2 endplate FX, T2-4 SP FX, T4 comp and R TVP FX - per Dr. Yetta Barre L2-3 TVP FX Acute hypoxic ventilator dependent resp failure - support for now P  MR Mild TBI/Concussion - plan PT/OT Scalp abrasion/contusion ETOH abuse - Add Precedex, plan SBIRT once extubated, thiamine and folate Schizophrenia - pharm med rec VTE - PAS P NS plan as does have spinal EDH FEN - no TF yet Dispo - ICU    Critical Care Total Time*: 42 Minutes  Violeta Gelinas, MD, MPH, FACS Trauma: [phone] General Surgery: [phone]  10/17/2017  *Care during the described time interval was provided by me. I have reviewed this patient's available data, including medical history, events of note, physical examination and test results as part of my evaluation.
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
## 2017-10-17 NOTE — Progress Notes (Signed)
Pt transported to 4N on ventilator. No issues during transport.

## 2017-10-17 NOTE — Progress Notes (Signed)
Arouses easily this morning. Follows commands by squeezing hands. Seems to have equal grips. More calm today. remains intubated.awaiting MRI of cervical spine but neurologically appears intact to in bed exam while sedated and intubated

## 2017-10-18 MED ORDER — DOCUSATE SODIUM 100 MG PO CAPS
100.0000 mg | ORAL_CAPSULE | Freq: Two times a day (BID) | ORAL | Status: DC
  Administered 2017-10-18 – 2017-10-29 (×22): 100 mg via ORAL
  Filled 2017-10-18 (×22): qty 1

## 2017-10-18 MED ORDER — FENTANYL CITRATE (PF) 100 MCG/2ML IJ SOLN
50.0000 ug | INTRAMUSCULAR | Status: DC | PRN
  Administered 2017-10-18 – 2017-10-23 (×20): 50 ug via INTRAVENOUS
  Filled 2017-10-18 (×20): qty 2

## 2017-10-18 MED ORDER — BUSPIRONE HCL 5 MG PO TABS
10.0000 mg | ORAL_TABLET | Freq: Three times a day (TID) | ORAL | Status: DC
Start: 2017-10-18 — End: 2017-10-29
  Administered 2017-10-18 – 2017-10-29 (×28): 10 mg via ORAL
  Filled 2017-10-18 (×5): qty 2
  Filled 2017-10-18: qty 1
  Filled 2017-10-18 (×12): qty 2
  Filled 2017-10-18 (×2): qty 1
  Filled 2017-10-18 (×3): qty 2
  Filled 2017-10-18: qty 1
  Filled 2017-10-18 (×2): qty 2
  Filled 2017-10-18: qty 1
  Filled 2017-10-18 (×3): qty 2

## 2017-10-18 MED ORDER — OXYCODONE HCL 5 MG PO TABS
5.0000 mg | ORAL_TABLET | ORAL | Status: DC | PRN
  Administered 2017-10-18 – 2017-10-25 (×26): 10 mg via ORAL
  Administered 2017-10-25: 5 mg via ORAL
  Administered 2017-10-25 – 2017-10-29 (×18): 10 mg via ORAL
  Filled 2017-10-18 (×45): qty 2

## 2017-10-18 MED ORDER — ORAL CARE MOUTH RINSE
15.0000 mL | Freq: Two times a day (BID) | OROMUCOSAL | Status: DC
  Administered 2017-10-18 – 2017-10-20 (×4): 15 mL via OROMUCOSAL

## 2017-10-18 MED ORDER — DOCUSATE SODIUM 50 MG/5ML PO LIQD
100.0000 mg | Freq: Two times a day (BID) | ORAL | Status: DC
  Administered 2017-10-18: 100 mg
  Filled 2017-10-18 (×2): qty 10

## 2017-10-18 NOTE — Progress Notes (Signed)
Patient arouses easily. He sits up in bed. He moves all extremities with good strength today. Seems to move his right upper extremity even better today. I explained to him that he has cervical spine fractures being treated in his collar and he proceeded to turn his head hard to the right and hard to the left show me his range of motion. He continues to do so despite asking him to stop. I left a rim to see another patient and as I walk but by he was sitting upright in bed intubated once again trying to show me his range of motion of his cervical spine. Once again explained that he should try to stop moving his neck. Compliance may be a real issue.
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
## 2017-10-18 NOTE — Progress Notes (Signed)
Prior to extubation pt was preoxygenated and suctioned without adverse reactions. Positive cuff leak noted. No distress noted pt placed on 3lpm . sats 100% at this time.

## 2017-10-19 ENCOUNTER — Inpatient Hospital Stay (HOSPITAL_COMMUNITY): Payer: Medicare Other

## 2017-10-19 DIAGNOSIS — F209 Schizophrenia, unspecified: Secondary | ICD-10-CM

## 2017-10-19 LAB — GLUCOSE, CAPILLARY
GLUCOSE-CAPILLARY: 165 mg/dL — AB (ref 70–99)
Glucose-Capillary: 169 mg/dL — ABNORMAL HIGH (ref 70–99)
Glucose-Capillary: 174 mg/dL — ABNORMAL HIGH (ref 70–99)

## 2017-10-19 MED ORDER — VITAMIN B-1 100 MG PO TABS
100.0000 mg | ORAL_TABLET | Freq: Every day | ORAL | Status: DC
  Administered 2017-10-19 – 2017-10-29 (×11): 100 mg via ORAL
  Filled 2017-10-19 (×11): qty 1

## 2017-10-19 MED ORDER — LORAZEPAM 2 MG/ML IJ SOLN: 2.0000 mg | INTRAMUSCULAR | Status: DC | PRN

## 2017-10-19 MED ORDER — FOLIC ACID 1 MG PO TABS
1.0000 mg | ORAL_TABLET | Freq: Every day | ORAL | Status: DC
  Administered 2017-10-19 – 2017-10-29 (×11): 1 mg via ORAL
  Filled 2017-10-19 (×12): qty 1

## 2017-10-19 MED ORDER — IPRATROPIUM-ALBUTEROL 0.5-2.5 (3) MG/3ML IN SOLN: 3.0000 mL | Freq: Four times a day (QID) | RESPIRATORY_TRACT | Status: DC | PRN

## 2017-10-19 MED ORDER — CHLORHEXIDINE GLUCONATE CLOTH 2 % EX PADS
6.0000 | MEDICATED_PAD | Freq: Every day | CUTANEOUS | Status: AC
  Administered 2017-10-19 – 2017-10-21 (×3): 6 via TOPICAL
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
## 2017-10-19 NOTE — Progress Notes (Signed)
Patient extubated and awake and alert. Complains of pain in his neck and thoracic region and lumbar spine and pain in his right shoulder. Denies numbness or tingling. Has significant pain in the right shoulder and upper arm with any movement of the right arm. Has excellent grip bilaterally and good wrist extension bilaterally. Has normal strength in the left upper extremity. Difficult to assess the right upper extremity because of pain with flexion of the elbow. Excellent strength in lower extremities. In cervical collar. Following. Would not use Lovenox at this point since she has a small epidural hematoma.
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
## 2017-10-19 NOTE — Progress Notes (Signed)
Orthopedic Tech Progress Note Patient Details:  Bobby Massey [birth year] [id number]  Ortho Devices Type of Ortho Device: Arm sling Ortho Device/Splint Location: rue Ortho Device/Splint Interventions: Application   Post Interventions Patient Tolerated: Well Instructions Provided: Care of device   Nikki DomCrawford, [name] 10/19/2017, 9:32 AM

## 2017-10-20 DIAGNOSIS — F200 Paranoid schizophrenia: Secondary | ICD-10-CM

## 2017-10-20 LAB — GLUCOSE, CAPILLARY: Glucose-Capillary: 150 mg/dL — ABNORMAL HIGH (ref 70–99)

## 2017-10-20 LAB — TRIGLYCERIDES: Triglycerides: 68 mg/dL (ref <150)

## 2017-10-20 MED ORDER — PALIPERIDONE ER 3 MG PO TB24
3.0000 mg | ORAL_TABLET | Freq: Every day | ORAL | Status: DC
  Administered 2017-10-20: 3 mg via ORAL
  Filled 2017-10-20 (×3): qty 1

## 2017-10-20 MED ORDER — POLYETHYLENE GLYCOL 3350 17 G PO PACK
17.0000 g | PACK | Freq: Every day | ORAL | Status: DC
  Administered 2017-10-20 – 2017-10-29 (×8): 17 g via ORAL
  Filled 2017-10-20 (×10): qty 1

## 2017-10-20 MED ORDER — PNEUMOCOCCAL VAC POLYVALENT 25 MCG/0.5ML IJ INJ
0.5000 mL | INJECTION | INTRAMUSCULAR | Status: AC
  Administered 2017-10-21: 0.5 mL via INTRAMUSCULAR
  Filled 2017-10-20: qty 0.5
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
## 2017-10-21 ENCOUNTER — Other Ambulatory Visit: Payer: Self-pay

## 2017-10-21 ENCOUNTER — Encounter (HOSPITAL_COMMUNITY): Payer: Self-pay

## 2017-10-21 NOTE — Progress Notes (Signed)
1900: Handoff report received from RN. Pt resting in bed. Discussed plan of care for the shift; pt amenable to plan.  2200: Pt questioning medications; he is amenable to buspar and endorses past benefit, but does not want to take Invega. Pt believes that providers are following him through his electronic record and prescribing medications against his will that come with undesired side effects. I listened to the pt, educated him on the medications, why they were prescribed to him, and his ability to refuse treatment if he feels necessary. The pt remained calm and matter of fact during this conversation and was grateful for the time spent.  0000: Pt resting comfortably.  0400: Pt continues resting comfortably.  0700: Handoff report given to RN. No acute events overnight.

## 2017-10-21 NOTE — Progress Notes (Signed)
Orthopedic Tech Progress Note Patient Details:  Bobby Massey 09-22-1965 [id number]  Ortho Devices Type of Ortho Device: Arm sling Ortho Device/Splint Location: applied replacement sling at rn request due to sloiling of old sling. Ortho Device/Splint Interventions: Ordered, Application, Adjustment   Post Interventions Patient Tolerated: Well Instructions Provided: Care of device, Adjustment of device   Trinna PostMartinez, Antwoin Lackey J 10/21/2017, 11:34 PM
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
## 2017-10-21 NOTE — Progress Notes (Signed)
Patient ID: Bobby Massey, male   DOB: [birth year], 52 y.o.   MRN: [id number] BP (!) 148/91 (BP Location: Right Arm)   Pulse 77   Temp 98.8 F (37.1 C) (Oral)   Resp (!) 22   Ht 5\' 10"  (1.778 m)   Wt 91.6 kg (201 lb 15.1 oz)   SpO2 95%   BMI 28.98 kg/m  Pain all over according to patient Moving left upper extremity well Moving both lower extremities well No new recommendations

## 2017-10-22 MED ORDER — PALIPERIDONE ER 3 MG PO TB24
3.0000 mg | ORAL_TABLET | Freq: Two times a day (BID) | ORAL | Status: DC
  Administered 2017-10-23 – 2017-10-29 (×7): 3 mg via ORAL
  Filled 2017-10-22 (×14): qty 1

## 2017-10-22 MED ORDER — MAGNESIUM CITRATE PO SOLN
1.0000 | Freq: Once | ORAL | Status: AC
  Administered 2017-10-22: 1 via ORAL
  Filled 2017-10-22: qty 296
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
## 2017-10-23 ENCOUNTER — Other Ambulatory Visit: Payer: Self-pay

## 2017-10-23 ENCOUNTER — Encounter (HOSPITAL_COMMUNITY): Payer: Self-pay

## 2017-10-23 LAB — TRIGLYCERIDES: Triglycerides: 69 mg/dL (ref <150)

## 2017-10-23 MED ORDER — ENOXAPARIN SODIUM 40 MG/0.4ML ~~LOC~~ SOLN
40.0000 mg | SUBCUTANEOUS | Status: DC
  Administered 2017-10-23 – 2017-10-28 (×6): 40 mg via SUBCUTANEOUS
  Filled 2017-10-23 (×6): qty 0.4
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
## 2017-10-23 NOTE — Progress Notes (Signed)
Pt seen and examined. No change neuro, pain improving, no neck pain in collar. R arm in sling, good hand movement, other 3 ext are normal in strength
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
## 2017-10-24 NOTE — Clinical Social Work Note (Signed)
Clinical Social Worker continuing to follow patient for support and discharge planning needs.  Patient has chosen a bed at [facility] and facility liaison has already completed all necessary admission paperwork at the bedside.  CSW has submitted for 30 day [facility] and awaiting determination on approval vs. Potential for Level 2 [facility] assignment.  CSW to update patient and medical team once decision is available.  CSW remains available for support and to facilitate patient discharge needs.  [name], [place] [phone]
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
## 2017-10-24 NOTE — Progress Notes (Addendum)
Social Work Emergency planning/management officer(Jessie) called. Was unable to get insurance authorization today. Latest will be by Friday.   Patient was given the information.   Did not have a response negative or positive.

## 2017-10-25 NOTE — Discharge Instructions (Signed)
Cervical Collar °A cervical collar is a device that supports your chin and the back of your head. It is used after a severe neck injury to protect your head and neck. It does this by restricting the movement of the top part of your spine, which is located in your neck. A cervical collar may be used when you have: °· A fractured neck. °· Ligament damage. °· A spinal cord injury. ° °What instructions should I follow? °· Wear the collar for as long as your health care provider instructs. °· Follow your health care provider's instructions about how to put on and take off your collar. °· Do not make your collar so tight that you feel pain or it is hard for you to breathe. °· Do not remove the collar unless your health care provider says it is okay. Ask your health care provider if you can remove the collar for showering or eating or to apply ice. °· Do not drive a car until your health care provider says it is okay. °· Keep all follow-up visits as directed by your health care provider. This is important. Any delay in getting necessary care can keep your injury from healing properly. °· Apply ice to the injured area: °? Put ice in a plastic bag. °? Place a towel between your skin and the bag. °? Leave the ice on for 20 minutes, 2-3 times per day for the first 2 days. °This information is not intended to replace advice given to you by your health care provider. Make sure you discuss any questions you have with your health care provider. °Document Released: 12/18/2003 Document Revised: 08/05/2015 Document Reviewed: 11/03/2013 °Elsevier Interactive Patient Education © 2018 [employer] ° °
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
## 2017-10-25 NOTE — Clinical Social Work Note (Signed)
Clinical Social Worker continuing to follow patient for support and discharge planning needs.  Patient is awaiting PASARR number - confirmation that patient will be evaluated in person today with hopes of an available number by tomorrow.  CSW updated MD/PA and facility.  CSW remains available for support and to facilitate patient discharge needs.  Macario GoldsJesse Perrion Diesel, [place] [phone]
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
## 2017-10-26 MED ORDER — THIAMINE HCL 100 MG PO TABS: 100.0000 mg | ORAL_TABLET | Freq: Every day | ORAL | Status: AC

## 2017-10-26 MED ORDER — PALIPERIDONE ER 3 MG PO TB24: 3.0000 mg | ORAL_TABLET | Freq: Two times a day (BID) | ORAL | Status: AC

## 2017-10-26 MED ORDER — POLYETHYLENE GLYCOL 3350 17 G PO PACK: 17.0000 g | PACK | Freq: Every day | ORAL | 0 refills | Status: AC

## 2017-10-26 MED ORDER — OXYCODONE HCL 5 MG PO TABS: 5.0000 mg | ORAL_TABLET | Freq: Four times a day (QID) | ORAL | 0 refills | Status: AC | PRN

## 2017-10-26 MED ORDER — BUSPIRONE HCL 10 MG PO TABS: 10.0000 mg | ORAL_TABLET | Freq: Three times a day (TID) | ORAL | Status: AC

## 2017-10-26 MED ORDER — PANTOPRAZOLE SODIUM 40 MG PO TBEC: 40.0000 mg | DELAYED_RELEASE_TABLET | Freq: Two times a day (BID) | ORAL | Status: AC

## 2017-10-26 MED ORDER — DOCUSATE SODIUM 100 MG PO CAPS: 100.0000 mg | ORAL_CAPSULE | Freq: Two times a day (BID) | ORAL | 0 refills | Status: AC | PRN

## 2017-10-26 MED ORDER — FOLIC ACID 1 MG PO TABS
1.0000 mg | ORAL_TABLET | Freq: Every day | ORAL | Status: AC
Start: 2017-10-26 — End: ?

## 2017-10-26 MED ORDER — IPRATROPIUM-ALBUTEROL 0.5-2.5 (3) MG/3ML IN SOLN: 3.0000 mL | Freq: Four times a day (QID) | RESPIRATORY_TRACT | Status: AC | PRN

## 2017-10-26 NOTE — Plan of Care (Signed)
  Problem: Bowel/Gastric: Goal: Ability to demonstrate the techniques of an individualized bowel program will improve Outcome: Adequate for Discharge   Problem: Education: Goal: Knowledge of disease or condition will improve Outcome: Adequate for Discharge Goal: Knowledge of the prescribed therapeutic regimen will improve Outcome: Adequate for Discharge   Problem: Coping: Goal: Ability to identify and develop effective coping behavior will improve Outcome: Adequate for Discharge Goal: Ability to verbalize feelings will improve Outcome: Adequate for Discharge   Problem: Self-Care: Goal: Ability to participate in self-care as condition permits will improve Outcome: Adequate for Discharge   Problem: Skin Integrity: Goal: Risk for impaired skin integrity will decrease Outcome: Adequate for Discharge   Problem: Urinary Elimination: Goal: Ability to achieve a regular elimination pattern will improve Outcome: Adequate for Discharge   Problem: Education: Goal: Knowledge of General Education information will improve Outcome: Adequate for Discharge   Problem: Health Behavior/Discharge Planning: Goal: Ability to manage health-related needs will improve Outcome: Adequate for Discharge   Problem: Clinical Measurements: Goal: Ability to maintain clinical measurements within normal limits will improve Outcome: Adequate for Discharge Goal: Will remain free from infection Outcome: Adequate for Discharge Goal: Diagnostic test results will improve Outcome: Adequate for Discharge Goal: Respiratory complications will improve Outcome: Adequate for Discharge Goal: Cardiovascular complication will be avoided Outcome: Adequate for Discharge   Problem: Activity: Goal: Risk for activity intolerance will decrease Outcome: Adequate for Discharge   Problem: Nutrition: Goal: Adequate nutrition will be maintained Outcome: Adequate for Discharge   Problem: Coping: Goal: Level of anxiety will  decrease Outcome: Adequate for Discharge   Problem: Elimination: Goal: Will not experience complications related to bowel motility Outcome: Adequate for Discharge Goal: Will not experience complications related to urinary retention Outcome: Adequate for Discharge   Problem: Pain Managment: Goal: General experience of comfort will improve Outcome: Adequate for Discharge   Problem: Safety: Goal: Ability to remain free from injury will improve Outcome: Adequate for Discharge   Problem: Skin Integrity: Goal: Risk for impaired skin integrity will decrease Outcome: Adequate for Discharge

## 2017-10-27 NOTE — Progress Notes (Signed)
CSW received a request from 1st shift RN asking why pt is not ready for D/C.  CSW spoke to pt's weekday CSW who stated pt's PASSR is still under review, that a DentistASSR worker had interviewed pt, but a number needed (PASSR number) has still not issued and must be issued before pt's facility can accept the pt.  RN updated.  Please reconsult if future social work needs arise.  CSW signing off, as social work intervention is no longer needed.  Dorothe PeaJonathan F. Paylin Hailu, LCSW, LCAS, CSI Clinical Social Worker Ph: [phone]

## 2017-10-27 NOTE — Progress Notes (Signed)
Patient ID: Carlton AdamDouglas Daws, male   DOB: 05/05/1965, 52 y.o.   MRN: [id number]       Subjective: No new complaints.  Walking around his room with his C-collar in place  Objective: Vital signs in last 24 hours: Temp:  [97.5 F (36.4 C)-99.5 F (37.5 C)] 97.6 F (36.4 C) (07/20 0844) Pulse Rate:  [66-72] 66 (07/20 0844) BP: (128-156)/(76-93) 128/76 (07/20 0844) SpO2:  [93 %-98 %] 93 % (07/20 0844) Last BM Date: 10/26/17  Intake/Output from previous day: 07/19 0701 - 07/20 0700 In: 820 [P.O.:480; I.V.:240] Out: -  Intake/Output this shift: No intake/output data recorded.  PE: Gen: NAD Neck: C-collar in place Heart: regular Lungs: some rhonchi at bases Abd: soft, NT, obese, +BS  Lab Results:  No results for input(s): WBC, HGB, HCT, PLT in the last 72 hours. BMET No results for input(s): NA, K, CL, CO2, GLUCOSE, BUN, CREATININE, CALCIUM in the last 72 hours. PT/INR No results for input(s): LABPROT, INR in the last 72 hours. CMP     Component Value Date/Time   NA 139 10/17/2017 0231   K 3.6 10/17/2017 0231   CL 106 10/17/2017 0231   CO2 23 10/17/2017 0231   GLUCOSE 109 (H) 10/17/2017 0231   BUN 6 10/17/2017 0231   CREATININE 0.63 10/17/2017 0231   CALCIUM 8.3 (L) 10/17/2017 0231   PROT 6.1 (L) 10/16/2017 1855   ALBUMIN 3.6 10/16/2017 1855   AST 34 10/16/2017 1855   ALT 22 10/16/2017 1855   ALKPHOS 72 10/16/2017 1855   BILITOT 0.7 10/16/2017 1855   GFRNONAA >60 10/17/2017 0231   GFRAA >60 10/17/2017 0231   Lipase  No results found for: LIPASE     Studies/Results: No results found.  Anti-infectives: Anti-infectives (From admission, onward)   None       Assessment/Plan PHBC B C7 lamina FX through C7 body -Collar per Dr. Yetta BarreJones, am xray showed cervical spine alignment is maintained T2 endplate FX, T2-4 SP FX, T4 comp and R TVP FX- per Dr. Yetta BarreJones L2-3 TVP FX Acute hypoxic ventilator dependent resp failure-extubated, IS, lungs CTA Mild  TBI/Concussion- plan PT/OT Scalp abrasion/contusion R scapula FX- NWB, sling per Dr. Dion SaucierLandau ETOH abuse- Precedex, SBIRT, thiamine and folate Schizophrenia- psych saw and rec invega 3mg  bid - updated theseorders 7/15 VTE- PAS,Lovenox started 7/16 after NS clearance FEN-reg diet ID: none Follow up: NS, trauma Dispo- SNF when able.  Apparently PSSR is still pending     LOS: 10 days    Letha CapeKelly E Annarose Ouellet , [facility] 10/27/2017, 12:39 PM Pager: [phone]

## 2017-10-27 NOTE — Progress Notes (Signed)
Patient did well today with a positive attitude about being discharge on Monday to rehab Center.  Patient very excited about new clothes to wear  With shorts and top someone gave him to wear.  Patient would like to walk around in the Unit tonight.
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
## 2017-10-29 LAB — TRIGLYCERIDES: TRIGLYCERIDES: 50 mg/dL (ref <150)

## 2017-10-29 NOTE — Clinical Social Work Note (Signed)
Clinical Social Worker facilitated patient discharge including contacting patient and facility to confirm patient discharge plans.  Clinical information faxed to facility and patient agreeable with plan.  CSW arranged ambulance transport via [facility] to [facility] [place].  RN to call report prior to discharge.  Clinical Social Worker will sign off for now as social work intervention is no longer needed. Please consult us again if new need arises.  Macario GoldsJesse [place], [place] [phone]

## 2017-10-29 NOTE — Progress Notes (Signed)
Discharge instructions given to patient and [facility]. Report given to RN at [facility]. Belongings, d/c instructions and prescription with [facility]/patient at time of d/c.  [facility] to transport patient.

## 2017-10-29 NOTE — Clinical Social Work Placement (Signed)
   CLINICAL SOCIAL WORK PLACEMENT  NOTE  Date:  10/29/2017  Patient Details  Name: Bobby Massey MRN: [id number] Date of Birth: November 22, 1965  Clinical Social Work is seeking post-discharge placement for this patient at the Skilled  Nursing Facility level of care (*CSW will initial, date and re-position this form in  chart as items are completed):  Yes   Patient/family provided with [facility] Clinical Social Work Department's list of facilities offering this level of care within the geographic area requested by the patient (or if unable, by the patient's family).  Yes   Patient/family informed of their freedom to choose among providers that offer the needed level of care, that participate in Medicare, Medicaid or managed care program needed by the patient, have an available bed and are willing to accept the patient.  Yes   Patient/family informed of [facility]'s ownership interest in [facility] and [facility], as well as of the fact that they are under no obligation to receive care at these facilities.  PASRR submitted to EDS on 10/23/17     PASRR number received on       Existing PASRR number confirmed on       FL2 transmitted to all facilities in geographic area requested by pt/family on 10/23/17     FL2 transmitted to all facilities within larger geographic area on       Patient informed that his/her managed care company has contracts with or will negotiate with certain facilities, including the following:        Yes   Patient/family informed of bed offers received.  [facility]     Physician recommends and patient chooses bed at      [facility].  Patient to be transferred to facility by Ambulance     Patient family notified on   of transfer.  Name of family member notified:  Patient to update sister     PHYSICIAN Please prepare priority discharge summary,  including medications     Additional Comment:   Macario GoldsJesse [place], LCSW [phone]
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
## 2017-10-30 ENCOUNTER — Encounter: Payer: Self-pay | Admitting: Emergency Medicine
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
